# Patient Record
Sex: Male | Born: 1955 | Race: White | Hispanic: No | Marital: Married | Smoking: Never smoker
Health system: Southern US, Community
[De-identification: ages and names within clinical notes are randomized; demographics above are authoritative.]

## PROBLEM LIST (undated history)

## (undated) DIAGNOSIS — T7840XA Allergy, unspecified, initial encounter: Secondary | ICD-10-CM

## (undated) DIAGNOSIS — R519 Headache, unspecified: Secondary | ICD-10-CM

## (undated) DIAGNOSIS — M1990 Unspecified osteoarthritis, unspecified site: Secondary | ICD-10-CM

## (undated) DIAGNOSIS — K219 Gastro-esophageal reflux disease without esophagitis: Secondary | ICD-10-CM

## (undated) DIAGNOSIS — Z872 Personal history of diseases of the skin and subcutaneous tissue: Secondary | ICD-10-CM

## (undated) DIAGNOSIS — N529 Male erectile dysfunction, unspecified: Secondary | ICD-10-CM

## (undated) DIAGNOSIS — J449 Chronic obstructive pulmonary disease, unspecified: Secondary | ICD-10-CM

## (undated) DIAGNOSIS — M722 Plantar fascial fibromatosis: Secondary | ICD-10-CM

## (undated) DIAGNOSIS — G8929 Other chronic pain: Secondary | ICD-10-CM

## (undated) HISTORY — DX: Allergy, unspecified, initial encounter: T78.40XA

## (undated) HISTORY — PX: COLONOSCOPY W/ BIOPSIES AND POLYPECTOMY: SHX1376

## (undated) HISTORY — DX: Headache, unspecified: R51.9

## (undated) HISTORY — DX: Male erectile dysfunction, unspecified: N52.9

## (undated) HISTORY — DX: Unspecified osteoarthritis, unspecified site: M19.90

## (undated) HISTORY — PX: CATARACT EXTRACTION: SUR2

## (undated) HISTORY — DX: Gastro-esophageal reflux disease without esophagitis: K21.9

## (undated) HISTORY — DX: Other chronic pain: G89.29

## (undated) HISTORY — DX: Personal history of diseases of the skin and subcutaneous tissue: Z87.2

## (undated) HISTORY — DX: Plantar fascial fibromatosis: M72.2

---

## 2004-08-07 ENCOUNTER — Encounter: Admission: RE | Admit: 2004-08-07 | Discharge: 2004-08-07 | Payer: Self-pay | Admitting: Family Medicine

## 2010-02-10 ENCOUNTER — Encounter: Payer: Self-pay | Admitting: Otolaryngology

## 2016-06-10 ENCOUNTER — Other Ambulatory Visit: Payer: Self-pay | Admitting: Otolaryngology

## 2016-06-10 DIAGNOSIS — J324 Chronic pansinusitis: Secondary | ICD-10-CM

## 2016-06-13 ENCOUNTER — Ambulatory Visit
Admission: RE | Admit: 2016-06-13 | Discharge: 2016-06-13 | Disposition: A | Discharge status: Home / Self Care | Payer: No Typology Code available for payment source | Source: Ambulatory Visit | Attending: Otolaryngology | Admitting: Otolaryngology

## 2016-06-13 DIAGNOSIS — J324 Chronic pansinusitis: Secondary | ICD-10-CM

## 2016-10-13 ENCOUNTER — Ambulatory Visit (INDEPENDENT_AMBULATORY_CARE_PROVIDER_SITE_OTHER): Payer: BLUE CROSS/BLUE SHIELD | Admitting: Neurology

## 2016-10-13 ENCOUNTER — Encounter: Payer: Self-pay | Admitting: Neurology

## 2016-10-13 VITALS — BP 149/89 | HR 60 | Ht 75.0 in | Wt 207.0 lb

## 2016-10-13 DIAGNOSIS — R400 Somnolence: Secondary | ICD-10-CM | POA: Diagnosis not present

## 2016-10-13 DIAGNOSIS — R519 Headache, unspecified: Secondary | ICD-10-CM

## 2016-10-13 DIAGNOSIS — R51 Headache: Secondary | ICD-10-CM | POA: Diagnosis not present

## 2016-10-13 DIAGNOSIS — G8929 Other chronic pain: Secondary | ICD-10-CM | POA: Insufficient documentation

## 2016-10-13 DIAGNOSIS — H93A9 Pulsatile tinnitus, unspecified ear: Secondary | ICD-10-CM

## 2016-10-13 DIAGNOSIS — I671 Cerebral aneurysm, nonruptured: Secondary | ICD-10-CM | POA: Diagnosis not present

## 2016-10-13 DIAGNOSIS — R42 Dizziness and giddiness: Secondary | ICD-10-CM

## 2016-10-13 NOTE — Addendum Note (Signed)
Addended by: Naomie Dean B on: 10/13/2016 08:14 AM   Modules accepted: Orders

## 2016-10-13 NOTE — Patient Instructions (Signed)
Remember to drink plenty of fluid, eat healthy meals and do not skip any meals. Try to eat protein with a every meal and eat a healthy snack such as fruit or nuts in between meals. Try to keep a regular sleep-wake schedule and try to exercise daily, particularly in the form of walking, 20-30 minutes a day, if you can.   As far as diagnostic testing: Imaging of the brain and evaluation by sleep doctor  I would like to see you back in 4-6 weeks, sooner if we need to. Please call us with any interim questions, concerns, problems, updates or refill requests.   Our phone number is [phone]. We also have an after hours call service for urgent matters and there is a physician on-call for urgent questions. For any emergencies you know to call 911 or go to the nearest emergency room

## 2016-10-13 NOTE — Progress Notes (Signed)
[facility]    Provider:  Dr Lucia Gaskins Referring Provider: Barbie Banner, MD Primary Care Physician:  Barbie Banner, MD  CC:  Facial pain  HPI:  Mark [name] is a 61 y.o. male here as a referral from Dr. Andrey Campanile for facial pain. Patient saw Dr. Dorma Russell who did not feel patient had sinus issues. Prednisone has not helped. Antibiotics have not helped. Past medical history of arthritis. Symptoms started last December as a mild dull headache in the frontal areas, no significant hx of headaches. PCP treated with amoxicillin and steroids, nasal sprays nothing changed. He saw Dr. Dorma Russell who treated as well and examined him and said this is not sinuses. His upper teeth hurt bilaterally, he has been to a dentist. Over his eyes he feels tightness and a "drawing sensation" in his eyes. Started with a dizzy spell, no other inciting events. Also then had Tinnitus in the left ear. Vision changes. Continuously moderate, he goes to sleep with it and wakes up with it. Continuous for over 4 months. Constant feeling, vision changes, new left-sided hearing changes. He has light sensitivity. No phonophotobia. No nausea, no vomiting. He snores worse, wakes with headaches, he is more fatigued during the day. He has a family history of headaches. He takes liquid tylenol occasionally, not often. Less than 10x in a month. No medication overuse. No other focal neurologic deficits, associated symptoms, inciting events or modifiable factors.  Reviewed notes, labs and imaging from outside physicians, which showed:  Patient was recently seen by neurology at [facility]. Patient was given baclofen, vitamin B 2, for migraine relief which includes magnesium and vitamin B2 and feverfew. He was prescribed baclofen every 8 hours as needed for moderate headache pain. An MRI was suggested. 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 20 one headache days a months, Epworth sleepiness  scale was 9, no suggestion of sleep apnea. Current episode started more than one month ago, patient had rare headaches in the past treated with Tylenol, had a dizzy spell in November the last one day, one month later he developed a mild headache and eventual aching in the teeth and eyes feel tired. The pain is located in the bifrontal, frontal and retro-orbital region. Radiates to the face. The quality is described as boring, dull and aching. The pain is mild. Associated with dizziness, eye pain, nausea, vomiting, sinus pressure and tinnitus. No blurred vision, ear pain, fever, neck pain, numbness, phonophobia, vomiting or weakness. He has teeth pain, sensitive to odors, symptoms are aggravated by bright light, history of migraines in the family.   CT maxillofacial w/o contrast: 1. Minimal right maxillary sinus mucosal thickening, otherwise clear sinuses. 2. Rightward nasal septal spurring.  Review of Systems: Patient complains of symptoms per HPI as well as the following symptoms: Fatigue, blurred vision, headache, snoring, joint pain, hearing loss, ringing in the ears. Pertinent negatives and positives per HPI. All others negative.   Social History   Social History  . Marital status: Married    Spouse name: N/A  . Number of children: N/A  . Years of education: N/A   Occupational History  . Not on file.   Social History Main Topics  . Smoking status: Never Smoker  . Smokeless tobacco: Never Used  . Alcohol use No  . Drug use: No  . Sexual activity: Not on file   Other Topics Concern  . Not on file   Social History Narrative  .  No narrative on file    Family History  Problem Relation Age of Onset  . Heart disease Mother   . COPD Father     Past Medical History:  Diagnosis Date  . Arthritis   . GERD (gastroesophageal reflux disease)     No past surgical history on file.  Current Outpatient Prescriptions  Medication Sig Dispense Refill  . baclofen (LIORESAL) 10 MG  tablet Take 5-10 mg by mouth daily.    . lansoprazole (PREVACID) 30 MG capsule Take 30 mg by mouth daily.    . montelukast (SINGULAIR) 10 MG tablet Take 10 mg by mouth daily.    . Omega-3 Fatty Acids (OMEGA 3 PO) Take by mouth.    . ranitidine (ZANTAC) 150 MG tablet Take 150 mg by mouth daily.    . sildenafil (REVATIO) 20 MG tablet Take 2 to 4 tablets as needed for E.D.     No current facility-administered medications for this visit.     Allergies as of 10/13/2016  . (No Known Allergies)    Vitals: BP (!) 149/89   Pulse 60   Ht  (1.905 m)   Wt 207 lb (93.9 kg)   BMI 25.87 kg/m  Last Weight:  Wt Readings from Last 1 Encounters:  10/13/16 207 lb (93.9 kg)   Last Height:   Ht Readings from Last 1 Encounters:  10/13/16  (1.905 m)   Physical exam: Exam: Gen: NAD, conversant, well nourised, obese, well groomed                     CV: RRR, no MRG. No Carotid Bruits. No peripheral edema, warm, nontender Eyes: Conjunctivae clear without exudates or hemorrhage  Neuro: Detailed Neurologic Exam  Speech:    Speech is normal; fluent and spontaneous with normal comprehension.  Cognition:    The patient is oriented to person, place, and time;     recent and remote memory intact;     language fluent;     normal attention, concentration,     fund of knowledge Cranial Nerves:    The pupils are equal, round, and reactive to light. The fundi are normal and spontaneous venous pulsations are present. Visual fields are full to finger confrontation. Extraocular movements are intact. Trigeminal sensation is intact and the muscles of mastication are normal. The face is symmetric. The palate elevates in the midline. Hearing intact. Voice is normal. Shoulder shrug is normal. The tongue has normal motion without fasciculations.   Coordination:    Normal finger to nose and heel to shin. Normal rapid alternating movements.   Gait:    Heel-toe and tandem gait are normal.   Motor  Observation:    No asymmetry, no atrophy, and no involuntary movements noted. Tone:    Normal muscle tone.    Posture:    Posture is normal. normal erect    Strength:    Strength is V/V in the upper and lower limbs.      Sensation: intact to LT     Reflex Exam:  DTR's:    Deep tendon reflexes in the upper and lower extremities are normal bilaterally.   Toes:    The toes are downgoing bilaterally.   Clonus:    Clonus is absent.       Assessment/Plan:  New onset headache in a patient > 50 with left ear hearing loss, vertigo, vision changes, pulsatile tinnitus  - Needs MRI brain to evaluate for masses, lesions, schwannomas or  other etiologies - Need MRA of the head due to left ear pulsatile tinnitus to eval for aneurysm - Sleep eval for morning headaches, appears to have a crowded airway, excessive daytime fatigue to evaluate for sleep apnea  Cc: Dr. Andrey Campanile  Discussed: To prevent or relieve headaches, try the following: Cool Compress. Lie down and place a cool compress on your head.  Avoid headache triggers. If certain foods or odors seem to have triggered your migraines in the past, avoid them. A headache diary might help you identify triggers.  Include physical activity in your daily routine. Try a daily walk or other moderate aerobic exercise.  Manage stress. Find healthy ways to cope with the stressors, such as delegating tasks on your to-do list.  Practice relaxation techniques. Try deep breathing, yoga, massage and visualization.  Eat regularly. Eating regularly scheduled meals and maintaining a healthy diet might help prevent headaches. Also, drink plenty of fluids.  Follow a regular sleep schedule. Sleep deprivation might contribute to headaches Consider biofeedback. With this mind-body technique, you learn to control certain bodily functions - such as muscle tension, heart rate and blood pressure - to prevent headaches or reduce headache pain.    Proceed to  emergency room if you experience new or worsening symptoms or symptoms do not resolve, if you have new neurologic symptoms or if headache is severe, or for any concerning symptom.   Provided education and documentation from American headache Society toolbox including articles on: chronic migraine medication overuse headache, chronic migraines, prevention of migraines, behavioral and other nonpharmacologic treatments for headache.  Orders Placed This Encounter  Procedures  . MR BRAIN W WO CONTRAST  . MR MRA HEAD WO CONTRAST  . Comprehensive metabolic panel  . CBC  . Sedimentation rate  . C-reactive protein  . Ambulatory referral to Sleep Studies    Naomie Dean, MD  [facility] [address] [place], [place] [address]  Phone [phone] Fax [phone]

## 2016-10-14 ENCOUNTER — Ambulatory Visit (INDEPENDENT_AMBULATORY_CARE_PROVIDER_SITE_OTHER): Payer: BLUE CROSS/BLUE SHIELD

## 2016-10-14 DIAGNOSIS — I671 Cerebral aneurysm, nonruptured: Secondary | ICD-10-CM | POA: Diagnosis not present

## 2016-10-14 DIAGNOSIS — R51 Headache: Secondary | ICD-10-CM

## 2016-10-14 DIAGNOSIS — R400 Somnolence: Secondary | ICD-10-CM | POA: Diagnosis not present

## 2016-10-14 DIAGNOSIS — H93A9 Pulsatile tinnitus, unspecified ear: Secondary | ICD-10-CM

## 2016-10-14 DIAGNOSIS — R42 Dizziness and giddiness: Secondary | ICD-10-CM | POA: Diagnosis not present

## 2016-10-14 DIAGNOSIS — R519 Headache, unspecified: Secondary | ICD-10-CM

## 2016-10-14 LAB — COMPREHENSIVE METABOLIC PANEL
A/G RATIO: 2 (ref 1.2–2.2)
ALBUMIN: 4.3 g/dL (ref 3.6–4.8)
ALT: 21 IU/L (ref 0–44)
AST: 20 IU/L (ref 0–40)
Alkaline Phosphatase: 57 IU/L (ref 39–117)
BUN / CREAT RATIO: 18 (ref 10–24)
BUN: 18 mg/dL (ref 8–27)
Bilirubin Total: 0.2 mg/dL (ref 0.0–1.2)
CALCIUM: 9.2 mg/dL (ref 8.6–10.2)
CO2: 24 mmol/L (ref 20–29)
CREATININE: 0.98 mg/dL (ref 0.76–1.27)
Chloride: 106 mmol/L (ref 96–106)
GFR, EST AFRICAN AMERICAN: 96 mL/min/{1.73_m2} (ref >59)
GFR, EST NON AFRICAN AMERICAN: 83 mL/min/{1.73_m2} (ref >59)
GLOBULIN, TOTAL: 2.1 g/dL (ref 1.5–4.5)
Glucose: 89 mg/dL (ref 65–99)
POTASSIUM: 5.1 mmol/L (ref 3.5–5.2)
SODIUM: 144 mmol/L (ref 134–144)
TOTAL PROTEIN: 6.4 g/dL (ref 6.0–8.5)

## 2016-10-14 LAB — CBC
Hematocrit: 46.6 % (ref 37.5–51.0)
Hemoglobin: 15.8 g/dL (ref 13.0–17.7)
MCH: 31.5 pg (ref 26.6–33.0)
MCHC: 33.9 g/dL (ref 31.5–35.7)
MCV: 93 fL (ref 79–97)
PLATELETS: 189 10*3/uL (ref 150–379)
RBC: 5.02 x10E6/uL (ref 4.14–5.80)
RDW: 13.4 % (ref 12.3–15.4)
WBC: 5.9 10*3/uL (ref 3.4–10.8)

## 2016-10-14 LAB — SEDIMENTATION RATE: SED RATE: 2 mm/h (ref 0–30)

## 2016-10-14 LAB — C-REACTIVE PROTEIN: CRP: 0.9 mg/L (ref 0.0–4.9)

## 2016-10-14 MED ORDER — GADOPENTETATE DIMEGLUMINE 469.01 MG/ML IV SOLN: 19.0000 mL | Freq: Once | INTRAVENOUS | Status: AC | PRN

## 2016-10-15 ENCOUNTER — Ambulatory Visit (INDEPENDENT_AMBULATORY_CARE_PROVIDER_SITE_OTHER): Payer: BLUE CROSS/BLUE SHIELD | Admitting: Neurology

## 2016-10-15 ENCOUNTER — Encounter: Payer: Self-pay | Admitting: Neurology

## 2016-10-15 VITALS — BP 142/83 | HR 65 | Ht 75.0 in | Wt 214.0 lb

## 2016-10-15 DIAGNOSIS — E663 Overweight: Secondary | ICD-10-CM | POA: Diagnosis not present

## 2016-10-15 DIAGNOSIS — G4719 Other hypersomnia: Secondary | ICD-10-CM | POA: Diagnosis not present

## 2016-10-15 DIAGNOSIS — R519 Headache, unspecified: Secondary | ICD-10-CM

## 2016-10-15 DIAGNOSIS — R0683 Snoring: Secondary | ICD-10-CM

## 2016-10-15 DIAGNOSIS — R51 Headache: Secondary | ICD-10-CM | POA: Diagnosis not present

## 2016-10-15 LAB — TSH

## 2016-10-15 NOTE — Patient Instructions (Addendum)
Thank you for choosing [facility] for your sleep related care! It was nice to meet you today!  Here is what we discussed today and what we came up with as our plan for you:    Based on your symptoms and your exam I believe you may be at some risk for obstructive sleep apnea or OSA, and I think we should proceed with a sleep study to determine whether you do or do not have OSA and how severe it is. If you have more than mild OSA, I want you to consider treatment with CPAP. Please remember, the risks and ramifications of moderate to severe obstructive sleep apnea or OSA are: Cardiovascular disease, including congestive heart failure, stroke, difficult to control hypertension, arrhythmias, and even type 2 diabetes has been linked to untreated OSA. Sleep apnea causes disruption of sleep and sleep deprivation in most cases, which, in turn, can cause recurrent headaches, problems with memory, mood, concentration, focus, and vigilance. Most people with untreated sleep apnea report excessive daytime sleepiness, which can affect their ability to drive. Please do not drive if you feel sleepy.   When you are ready, our sleep lab administrative assistant, Alvis Lemmings will call you to schedule your sleep study. If you don't hear back from her by about 2 weeks from now, please feel free to call her at [phone]. You can leave a message with your phone number and concerns, if you get the voicemail box. She will call back as soon as possible.

## 2016-10-15 NOTE — Progress Notes (Signed)
Subjective:    Patient ID: Mark [name] is a 61 y.o. male.  HPI     Huston Foley, MD, PhD [facility] [address], [address] [address] [place], [place] [address]  Dear Desma Maxim,   I saw your patient, Mark [name], upon your kind request in my clinic today for initial consultation of his sleep disorder, in particular, concern for underlying obstructive sleep apnea. The patient is accompanied by his wife today. As you know, Mark [name] is a 61 year old right-handed gentleman with an underlying medical history of reflux disease, arthritis, recurrent headaches and borderline overweight state, who reports snoring and excessive daytime somnolence. I reviewed your office note from 10/13/2016. He has had a 10 month history of constant, dull, achy, mostly frontal HAs, treated for sinusitis repeatedly. Saw his eye doctor, got update glasses and has readers. Has occasional RLS symptoms, not bothersome.  Saw his dentist and ENT (Dr. Jac Canavan), had a CT sinuses.  His Epworth sleepiness score is 7 out of 24, fatigue score is 17 out of 63. He occasionally takes a 30 minute nap. He is retired from [employer] and is busy with other activities including spending time with his grandchildren. He drinks quite a bit of coffee about 20-24 ounces a day, typically no sodas or tea. He quit smoking 2-1/2 years ago, smoked for 40 years. Bedtime is around 10 and he likes to read for up to 30 minutes. Wake up time varies, sometimes as early as 5, he used to wake up early for work. He denies morning headaches or night to night nocturia, snoring is typically on the milder side per wife, he does not tend to twitch or tic in his sleep. He had MRI brain and MRA yesterday, results pending.  His Past Medical History Is Significant For: Past Medical History:  Diagnosis Date  . Arthritis   . GERD (gastroesophageal reflux disease)     His Past Surgical History Is Significant For: No past surgical history on  file.  His Family History Is Significant For: Family History  Problem Relation Age of Onset  . Heart disease Mother   . COPD Father     His Social History Is Significant For: Social History   Social History  . Marital status: Married    Spouse name: N/A  . Number of children: N/A  . Years of education: N/A   Social History Main Topics  . Smoking status: Never Smoker  . Smokeless tobacco: Never Used  . Alcohol use No  . Drug use: No  . Sexual activity: Not Asked   Other Topics Concern  . None   Social History Narrative  . None    His Allergies Are:  No Known Allergies:   His Current Medications Are:  Outpatient Encounter Prescriptions as of 10/15/2016  Medication Sig  . baclofen (LIORESAL) 10 MG tablet Take 5-10 mg by mouth daily.  . lansoprazole (PREVACID) 30 MG capsule Take 30 mg by mouth every 3 (three) days.   . MELOXICAM PO Take by mouth 4 (four) times a week.  . montelukast (SINGULAIR) 10 MG tablet Take 10 mg by mouth daily.  . Omega-3 Fatty Acids (OMEGA 3 PO) Take by mouth.  . ranitidine (ZANTAC) 150 MG tablet Take 150 mg by mouth 4 (four) times a week.   . sildenafil (REVATIO) 20 MG tablet Take 2 to 4 tablets as needed for E.D.   Facility-Administered Encounter Medications as of 10/15/2016  Medication  . gadopentetate dimeglumine (MAGNEVIST) injection  19 mL  :  Review of Systems:  Out of a complete 14 point review of systems, all are reviewed and negative with the exception of these symptoms as listed below:  Review of Systems  Neurological:       Pt presents today to discuss his sleep. Pt has never had a sleep study but does endorse snoring.  Epworth Sleepiness Scale 0= would never doze 1= slight chance of dozing 2= moderate chance of dozing 3= high chance of dozing  Sitting and reading: 1 Watching TV: 1 Sitting inactive in a public place (ex. Theater or meeting): 1 As a passenger in a car for an hour without a break: 1 Lying down to rest in  the afternoon: 2 Sitting and talking to someone: 0 Sitting quietly after lunch (no alcohol): 1 In a car, while stopped in traffic: 0 Total: 7     Objective:  Neurological Exam  Physical Exam Physical Examination:   Vitals:   10/15/16 0819  BP: (!) 142/83  Pulse: 65    General Examination: The patient is a very pleasant 61 y.o. male in no acute distress. He appears well-developed and well-nourished and well groomed.   HEENT: Normocephalic, atraumatic, pupils are equal, round and reactive to light and accommodation. Some excessive blinking noted. Extraocular tracking is good without limitation to gaze excursion or nystagmus noted. Normal smooth pursuit is noted. Hearing is grossly intact. Face is symmetric with normal facial animation and normal facial sensation. Speech is clear with no dysarthria noted. There is no hypophonia. There is no lip, neck/head, jaw or voice tremor. Neck is supple with full range of passive and active motion. There are no carotid bruits on auscultation. Oropharynx exam reveals: mild mouth dryness, adequate dental hygiene and moderate airway crowding, due to Smaller airway entry, thicker soft palate, tonsils in place, about 1+ bilaterally. Neck circumference is 15-3/8 inches. Mallampati is class II. He has a mild to moderate overbite.  Chest: Clear to auscultation without wheezing, rhonchi or crackles noted.  Heart: unremarkable exam.    Abdomen: Non-distended.  Extremities: There is no edema.  Skin: Warm and dry without trophic changes noted. There are no .  Musculoskeletal: exam reveals no obvious joint deformities, tenderness or joint swelling or erythema.   Neurologically:  Mental status: The patient is awake, alert and oriented in all 4 spheres. His immediate and remote memory, attention, language skills and fund of knowledge are appropriate. There is no evidence of aphasia, agnosia, apraxia or anomia. Speech is clear with normal prosody and  enunciation. Thought process is linear. Mood is normal and affect is normal.  Cranial nerves II - XII are as described above under HEENT exam. In addition: shoulder shrug is normal with equal shoulder height noted. Motor exam: Normal bulk, strength and tone is noted. There is no drift, tremor or rebound. Reflexes are 2+ throughout. Fine motor skills and coordination: intact with normal finger taps, normal hand movements, normal rapid alternating patting, normal foot taps and normal foot agility.  Cerebellar testing: No dysmetria or intention tremor on finger to nose testing. Heel to shin is unremarkable bilaterally. There is no truncal or gait ataxia.  Sensory exam: intact to light touch in the upper and lower extremities.  Gait, station and balance: He stands easily. No veering to one side is noted. No leaning to one side is noted. Posture is age-appropriate and stance is narrow based. Gait shows normal stride length and normal pace. No problems turning are noted, reduced arm  swing on the right, holds it stiffer as if protecting.               Assessment and Plan:  In summary, Mark [name] is a very pleasant 62 y.o.-year old male  with an underlying medical history of reflux disease, arthritis, recurrent headaches and borderline overweight state, who presents for sleep evaluation. While he does not have a telltale history concerning for sleep apnea, his new onset headaches and crowded appearing airway could point towards underlying obstructive sleep apnea (OSA). I had a long chat with the patient and his wife about my findings and the diagnosis of OSA, its prognosis and treatment options. We talked about medical treatments, surgical interventions and non-pharmacological approaches. I explained in particular the risks and ramifications of untreated moderate to severe OSA, especially with respect to developing cardiovascular disease down the Road, including congestive heart failure, difficult to treat  hypertension, cardiac arrhythmias, or stroke. Even type 2 diabetes has, in part, been linked to untreated OSA. Symptoms of untreated OSA include daytime sleepiness, memory problems, mood irritability and mood disorder such as depression and anxiety, lack of energy, as well as recurrent headaches, especially morning headaches. We talked about maintaining a healthy lifestyle in general, as well as the importance of weight control. I encouraged the patient to eat healthy, exercise daily and keep well hydrated, to keep a scheduled bedtime and wake time routine, to not skip any meals and eat healthy snacks in between meals. I advised the patient not to drive when feeling sleepy. I recommended the following at this time: sleep study with potential positive airway pressure titration. (We will score hypopneas at 3%). He would like to wait it out a little longer, he recently had scans and would like to know the results and would like to think about sleep study testing. I explained the sleep test procedure to the patient and also outlined possible surgical and non-surgical treatment options of OSA, including the use of a custom-made dental device (which would require a referral to a specialist dentist or oral surgeon), upper airway surgical options, such as pillar implants, radiofrequency surgery, tongue base surgery, and UPPP (which would involve a referral to an ENT surgeon). Rarely, jaw surgery such as mandibular advancement may be considered.  I also explained the CPAP treatment option to the patient, who indicated that he would be willing to try CPAP if the need arises. I explained the importance of being compliant with PAP treatment, not only for insurance purposes but primarily to improve His symptoms, and for the patient's long term health benefit, including to reduce His cardiovascular risks. I answered all their questions today and the patient and his wife were in agreement. I will likely see him back after the  sleep study is completed and encouraged him to call with any interim questions, concerns, problems or updates.   Thank you very much for allowing me to participate in the care of this nice patient. If I can be of any further assistance to you please do not hesitate to talk to me.  Sincerely,   Huston Foley, MD, PhD

## 2016-10-17 ENCOUNTER — Telehealth: Payer: Self-pay | Admitting: *Deleted

## 2016-10-17 LAB — SPECIMEN STATUS REPORT

## 2016-10-17 LAB — TSH: TSH: 2.25 u[IU]/mL (ref 0.450–4.500)

## 2016-10-17 NOTE — Telephone Encounter (Signed)
LVM informing patient his TSH (thyroid) lab test is normal . Left number for any questions, advised office closes at noon today.

## 2016-10-17 NOTE — Telephone Encounter (Signed)
Pt returned RN's call. Msg relayed, he understood and did not have any questions  FYI

## 2016-10-17 NOTE — Telephone Encounter (Signed)
Spoke with patient and informed him, per Dr Lucia Gaskins that both the MRI of brain and MRA of head were normal imaging tests, normal results. He verbalized understanding, appreciation.

## 2016-11-13 ENCOUNTER — Telehealth: Payer: Self-pay

## 2016-11-13 NOTE — Telephone Encounter (Signed)
Spoke with patient to schedule home sleep sleep study. Insurance denied in-lab study. He wants to wait til he sees Doctor Lucia GaskinsAhern on 11-24-16

## 2016-11-24 ENCOUNTER — Ambulatory Visit: Payer: BLUE CROSS/BLUE SHIELD | Admitting: Neurology

## 2018-02-23 ENCOUNTER — Ambulatory Visit: Payer: BC Managed Care – PPO | Admitting: Allergy

## 2018-02-23 ENCOUNTER — Encounter: Payer: Self-pay | Admitting: Allergy

## 2018-02-23 VITALS — BP 124/88 | HR 69 | Temp 97.8°F | Resp 16 | Ht 74.0 in | Wt 215.8 lb

## 2018-02-23 DIAGNOSIS — J310 Chronic rhinitis: Secondary | ICD-10-CM

## 2018-02-23 NOTE — Patient Instructions (Signed)
   May use Flonase 1-2 sprays daily.   Nasal saline spray (i.e., Simply Saline) or nasal saline lavage (i.e., NeilMed) is recommended as needed and prior to medicated nasal sprays.  Get eyes checked every year. If having issues with pressure then may need to stop the nasal spray.   Nose Bleeds: Nosebleeds are very common.  Site of the bleeding is typically on the septum or at the very front of the nose.  Some of the more common causes are from trauma, inflammation or medication induced. Preventative treatment: 1.  Apply saline nasal gel in each nostril twice a day for 2 weeks to allow the nasal mucosa to heal 2.  Consider using a humidifier in the winter 3. Try to keep your blood pressure as normal as possible (120/80)  Follow up as needed.

## 2018-02-23 NOTE — Assessment & Plan Note (Signed)
Perennial rhinitis symptoms for the past 10+ years with worsening in the winter.  2019 skin testing performed at an outside facility was negative to environmental allergies. Currently on Flonase Sensimist 2 sprays once a day for 1 year with good benefit.  Discussed with patient that he most likely has nonallergic rhinitis which seems to be responding well to the Flonase. He was concerned about long-term side effects of Flonase use. Most common side effect is epistaxis which he has not had. Reviewed proper nasal spray use to minimize epistaxis. If it does occur advised him to stop using Flonase for 1 week or so let the nasal mucosa heal.  Other rare but serious side effects include increased intraocular pressure. Patient has been getting annual eye exams with no issues.  Continue Flonase 1-2 sprays daily.   Nasal saline spray (i.e., Simply Saline) or nasal saline lavage (i.e., NeilMed) is recommended as needed and prior to medicated nasal sprays.  Get eyes checked every year. If having issues with pressure then may need to stop the nasal spray.

## 2018-02-23 NOTE — Progress Notes (Signed)
New Patient Note  RE: Mark [name] MRN: [id number] DOB: 06-Nov-1955 Date of Office Visit: 02/23/2018  Referring provider: No ref. provider found Primary care provider: Barbie BannerWilson, [name], MD  Chief Complaint: Advice Only (Second Opinion)  History of Present Illness: I had the pleasure of seeing Mark [name] for initial evaluation at the [facility] on 02/23/2018. He is a 63 y.o. male, who is self-referred here for the evaluation of second opinion.  He reports symptoms of nasal congestions, rhinorrhea, sneezing, itchy eyes . Symptoms have been going on for 10+ years. The symptoms are present all year around with worsening in winter. Other triggers include exposure to none. Anosmia: no. Headache: yes but improved with Flonase. He has used Flonase sensamist 2 sprays QD x 1 yr with fair improvement in symptoms. No nasal bleeds. Sinus infections: no. Previous work up includes: 2019 skin testing at [facility] was negative to environmental allergies.  Patient has been using Flonase sensamist which is helping with the nasal congestion and headaches. Concerned about long term use side effects.  Tried montelukast with no benefit.  Previous ENT evaluation: 1 year ago was evaluated by Dr. Ermalinda BarriosEric Kraus. Previous sinus imaging: Xrays were clear. No sinus surgery in the past.   Gets annual eye exams.   Assessment and Plan: Mark [name] is a 63 y.o. male with: Non-allergic rhinitis Perennial rhinitis symptoms for the past 10+ years with worsening in the winter.  2019 skin testing performed at an outside facility was negative to environmental allergies. Currently on Flonase Sensimist 2 sprays once a day for 1 year with good benefit.  Discussed with patient that he most likely has nonallergic rhinitis which seems to be responding well to the Flonase. He was concerned about long-term side effects of Flonase use. Most common side effect is epistaxis which he has not had. Reviewed proper nasal  spray use to minimize epistaxis. If it does occur advised him to stop using Flonase for 1 week or so let the nasal mucosa heal.  Other rare but serious side effects include increased intraocular pressure. Patient has been getting annual eye exams with no issues.  Continue Flonase 1-2 sprays daily.   Nasal saline spray (i.e., Simply Saline) or nasal saline lavage (i.e., NeilMed) is recommended as needed and prior to medicated nasal sprays.  Get eyes checked every year. If having issues with pressure then may need to stop the nasal spray.  Return if symptoms worsen or fail to improve.  Other allergy screening: Asthma: no Food allergy: no Medication allergy: no Hymenoptera allergy: no Urticaria: no Eczema:no History of recurrent infections suggestive of immunodeficency: no  Diagnostics: None.     Past Medical History: Patient Active Problem List   Diagnosis Date Noted  . Non-allergic rhinitis 02/23/2018  . Chronic intractable headache 10/13/2016   Past Medical History:  Diagnosis Date  . Arthritis   . GERD (gastroesophageal reflux disease)    Past Surgical History: History reviewed. No pertinent surgical history. Medication List:  Current Outpatient Medications  Medication Sig Dispense Refill  . acetaminophen (TYLENOL) 500 MG tablet Take by mouth.    . baclofen (LIORESAL) 10 MG tablet Take 5-10 mg by mouth daily.    . fluticasone (FLONASE) 50 MCG/ACT nasal spray Place into both nostrils daily.    . lansoprazole (PREVACID) 30 MG capsule TAKE ONE CAPSULE BY MOUTH DAILY AS NEEDED TO CONTROL ACID REFLUX SYMPTOMS    . meloxicam (MOBIC) 15 MG tablet Take one tablet daily  with food as needed for arthritis pain.    . Omega-3 Fatty Acids (OMEGA 3 PO) Take by mouth.    . ranitidine (ZANTAC) 150 MG tablet Take 150 mg by mouth 4 (four) times a week.     . sildenafil (REVATIO) 20 MG tablet Take 2 to 4 tablets as needed for E.D.    . montelukast (SINGULAIR) 10 MG tablet Take 10 mg by  mouth daily.     No current facility-administered medications for this visit.    Facility-Administered Medications Ordered in Other Visits  Medication Dose Route Frequency Provider Last Rate Last Dose  . gadopentetate dimeglumine (MAGNEVIST) injection 19 mL  19 mL Intravenous Once PRN Anson Fret, MD       Allergies: No Known Allergies Social History: Social History   Socioeconomic History  . Marital status: Married    Spouse name: Not on file  . Number of children: Not on file  . Years of education: Not on file  . Highest education level: Not on file  Occupational History  . Not on file  Social Needs  . Financial resource strain: Not on file  . Food insecurity:    Worry: Not on file    Inability: Not on file  . Transportation needs:    Medical: Not on file    Non-medical: Not on file  Tobacco Use  . Smoking status: Never Smoker  . Smokeless tobacco: Never Used  Substance and Sexual Activity  . Alcohol use: No  . Drug use: No  . Sexual activity: Not on file  Lifestyle  . Physical activity:    Days per week: Not on file    Minutes per session: Not on file  . Stress: Not on file  Relationships  . Social connections:    Talks on phone: Not on file    Gets together: Not on file    Attends religious service: Not on file    Active member of club or organization: Not on file    Attends meetings of clubs or organizations: Not on file    Relationship status: Not on file  Other Topics Concern  . Not on file  Social History Narrative  . Not on file   Lives in a [address] year old home. Smoking: smoked from 1975-2015 Occupation: retired  Landscape architect History: Immunologist in the house: no Engineer, civil (consulting) in the family room: no Carpet in the bedroom: yes Heating: gas and electric Cooling: central Pet: no  Family History: Family History  Problem Relation Age of Onset  . Heart disease Mother   . COPD Father    Problem                                Relation Asthma                                   No  Eczema                                No  Food allergy                          No  Allergic rhino conjunctivitis     No   Review of Systems  Constitutional: Negative for appetite change,  chills, fever and unexpected weight change.  HENT: Positive for congestion. Negative for rhinorrhea.   Eyes: Negative for itching.  Respiratory: Negative for cough, chest tightness, shortness of breath and wheezing.   Cardiovascular: Negative for chest pain.  Gastrointestinal: Negative for abdominal pain.  Genitourinary: Negative for difficulty urinating.  Skin: Negative for rash.  Allergic/Immunologic: Negative for environmental allergies and food allergies.  Neurological: Positive for headaches.   Objective: BP 124/88 (BP Location: Left Arm, Patient Position: Sitting, Cuff Size: Normal)   Pulse 69   Temp 97.8 F (36.6 C) (Oral)   Resp 16   Ht 6\' 2"  (1.88 m)   Wt 215 lb 12.8 oz (97.9 kg)   SpO2 98%   BMI 27.71 kg/m  Body mass index is 27.71 kg/m. Physical Exam  Constitutional: He is oriented to person, place, and time. He appears well-developed and well-nourished.  HENT:  Head: Normocephalic and atraumatic.  Right Ear: External ear normal.  Left Ear: External ear normal.  Nose: Nose normal.  Mouth/Throat: Oropharynx is clear and moist.  Eyes: Conjunctivae and EOM are normal.  Neck: Neck supple.  Cardiovascular: Normal rate, regular rhythm and normal heart sounds. Exam reveals no gallop and no friction rub.  No murmur heard. Pulmonary/Chest: Effort normal and breath sounds normal. He has no wheezes. He has no rales.  Abdominal: Soft.  Lymphadenopathy:    He has no cervical adenopathy.  Neurological: He is alert and oriented to person, place, and time.  Skin: Skin is warm. No rash noted.  Psychiatric: He has a normal mood and affect. His behavior is normal.  Nursing note and vitals reviewed.  The plan was reviewed with the  patient/family, and all questions/concerned were addressed.  It was my pleasure to see Braven today and participate in his care. Please feel free to contact me with any questions or concerns.  Sincerely,  Wyline Mood, DO Allergy & Immunology  [facility] office: [phone] [facility] office: [phone]

## 2019-03-01 ENCOUNTER — Ambulatory Visit: Payer: BC Managed Care – PPO | Attending: Internal Medicine

## 2019-03-03 ENCOUNTER — Ambulatory Visit: Payer: Self-pay

## 2019-05-18 NOTE — Progress Notes (Signed)
Cardiology Office Note:    Date:  05/19/2019   ID:  NEFTALY SWISS, DOB 1955-06-02, MRN [id number]  PCP:  Christain Sacramento, MD  Cardiologist:  No primary care provider on file.   Referring MD: Christain Sacramento, MD   Chief Complaint  Patient presents with  . Advice Only    Does not feel well  . Hyperlipidemia    History of Present Illness:    Mark [name] is a 64 y.o. male with a hx of hyperlipidemia, family h/o CAD, ED, and feels fatigued and not optimal.  Here to make sure that there are no cardiovascular issues causing his complaints..  His mother, Ms. Darden Palmer was a patient of mine.  He does not have any information on his father.  His mother had PAD and CAD.  She was a smoker.  He is a prior 40-year smoker.  He has been experiencing decreased energy over the past 5 to 6 years.  He has brief episodes of up to 5 seconds where his heart will flutter.  He has been having difficulty sleeping.  He awakens frequently.  His wife is telling him that he snores.  He also occasionally awakens with the left arm feeling numb.  Sitting up or changing positions helps him to go away.  He wanted to be sure that this was not related to his heart.  Despite these concerns, he remains active.  He has a farm.  He does all that is required of him.  It does not precipitate chest discomfort.  He denies orthopnea and PND.  Past Medical History:  Diagnosis Date  . Allergies   . Arthritis   . Chronic headaches   . ED (erectile dysfunction)   . GERD (gastroesophageal reflux disease)   . H/O sebaceous cyst   . Plantar fasciitis     Past Surgical History:  Procedure Laterality Date  . COLONOSCOPY W/ BIOPSIES AND POLYPECTOMY      Current Medications: Current Meds  Medication Sig  . acetaminophen (TYLENOL) 500 MG tablet Take 500 mg by mouth as needed.   . baclofen (LIORESAL) 10 MG tablet Take 5-10 mg by mouth as needed.   . fluticasone (FLONASE) 50 MCG/ACT nasal spray Place into both nostrils  daily.  . lansoprazole (PREVACID) 30 MG capsule TAKE ONE CAPSULE BY MOUTH DAILY AS NEEDED TO CONTROL ACID REFLUX SYMPTOMS  . meloxicam (MOBIC) 15 MG tablet Take one tablet daily with food as needed for arthritis pain.  . Omega-3 Fatty Acids (OMEGA 3 PO) Take by mouth.  . ranitidine (ZANTAC) 150 MG tablet Take 150 mg by mouth as needed.   . sildenafil (REVATIO) 20 MG tablet Take 2 to 4 tablets as needed for E.D.     Allergies:   Patient has no known allergies.   Social History   Socioeconomic History  . Marital status: Married    Spouse name: Not on file  . Number of children: Not on file  . Years of education: Not on file  . Highest education level: Not on file  Occupational History  . Not on file  Tobacco Use  . Smoking status: Never Smoker  . Smokeless tobacco: Never Used  Substance and Sexual Activity  . Alcohol use: No  . Drug use: No  . Sexual activity: Not on file  Other Topics Concern  . Not on file  Social History Narrative  . Not on file   Social Determinants of Health   Financial Resource Strain:   .  Difficulty of Paying Living Expenses:   Food Insecurity:   . Worried About Programme researcher, broadcasting/film/video in the Last Year:   . Barista in the Last Year:   Transportation Needs:   . Freight forwarder (Medical):   Marland Kitchen Lack of Transportation (Non-Medical):   Physical Activity:   . Days of Exercise per Week:   . Minutes of Exercise per Session:   Stress:   . Feeling of Stress :   Social Connections:   . Frequency of Communication with Friends and Family:   . Frequency of Social Gatherings with Friends and Family:   . Attends Religious Services:   . Active Member of Clubs or Organizations:   . Attends Banker Meetings:   Marland Kitchen Marital Status:      Family History: The patient's family history includes COPD in his father; Heart disease in his mother.  ROS:   Please see the history of present illness.    Arthritis in both knees slows his ability to  ambulate.  He knows of having an elevated cholesterol.  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  He has run LDL cholesterol levels greater than 130 on each instance.  Most recently 157.  All other systems reviewed and are negative.  EKGs/Labs/Other Studies Reviewed:    The following studies were reviewed today:  LDL 157 (03/18/19)  EKG:  EKG mistakenly, and EKG was not performed.  Recent Labs: No results found for requested labs within last 8760 hours.  Recent Lipid Panel No results found for: CHOL, TRIG, HDL, CHOLHDL, VLDL, LDLCALC, LDLDIRECT  Physical Exam:    VS:  BP 128/84   Pulse 60   Ht 6\' 2"  (1.88 m)   Wt 216 lb (98 kg)   SpO2 97%   BMI 27.73 kg/m     Wt Readings from Last 3 Encounters:  05/19/19 216 lb (98 kg)  02/23/18 215 lb 12.8 oz (97.9 kg)  10/15/16 214 lb (97.1 kg)     GEN: Healthy-appearing and slightly overweight. No acute distress HEENT: Normal NECK: No JVD. LYMPHATICS: No lymphadenopathy CARDIAC:  RRR without murmur, gallop, or edema. VASCULAR:  Normal Pulses. No bruits. RESPIRATORY:  Clear to auscultation without rales, wheezing or rhonchi  ABDOMEN: Soft, non-tender, non-distended, No pulsatile mass, MUSCULOSKELETAL: No deformity  SKIN: Warm and dry NEUROLOGIC:  Alert and oriented x 3 PSYCHIATRIC:  Normal affect   ASSESSMENT:    1. Family history of early CAD   2. Hyperlipidemia LDL goal <70   3. Other fatigue   4. Snoring   5. Erectile dysfunction, unspecified erectile dysfunction type   6. SOB (shortness of breath)   7. Educated about COVID-19 virus infection   8. Palpitations    PLAN:    In order of problems listed above:  1. His personal risk factors for CAD include hyperlipidemia, prior smoking, and family history.  We will perform a coronary calcium score to help identify risk.  I want him to start statin therapy but he is resistant.  The calcium score will help further risk stratify. 2. Statin therapy is  needed.  He is resistant.  Coronaries calcium score is being ordered. 3. He is not sleeping well, snores, and I believe fatigue may be related to sleep apnea. 4. Rule out sleep apnea.  Sleep study will be done. 5. This is a vascular risk factor. 6. Further evaluation may be necessary depending upon findings of the calcium score.  He may need an echo. 7. COVID-19 vaccine has been received.  Social distancing and mask wearing is being practiced. 8. These are very brief and at this point we are not likely to catch much because they are infrequent.  May need monitoring at some point in the future.  Overall education and awareness concerning primary risk prevention was discussed in detail: LDL less than 70, hemoglobin A1c less than 7, blood pressure target less than 130/80 mmHg, >150 minutes of moderate aerobic activity per week, avoidance of smoking, weight control (via diet and exercise), and continued surveillance/management of/for obstructive sleep apnea.    Medication Adjustments/Labs and Tests Ordered: Current medicines are reviewed at length with the patient today.  Concerns regarding medicines are outlined above.  Orders Placed This Encounter  Procedures  . CT CARDIAC SCORING  . Split night study   No orders of the defined types were placed in this encounter.   Patient Instructions  Medication Instructions:  Your physician recommends that you continue on your current medications as directed. Please refer to the Current Medication list given to you today.  *If you need a refill on your cardiac medications before your next appointment, please call your pharmacy*   Lab Work: None If you have labs (blood work) drawn today and your tests are completely normal, you will receive your results only by: Marland Kitchen MyChart Message (if you have MyChart) OR . A paper copy in the mail If you have any lab test that is abnormal or we need to change your treatment, we will call you to review the  results.   Testing/Procedures: Your physician recommends that you have a Calcium Score performed.  Your physician has recommended that you have a sleep study. This test records several body functions during sleep, including: brain activity, eye movement, oxygen and carbon dioxide blood levels, heart rate and rhythm, breathing rate and rhythm, the flow of air through your mouth and nose, snoring, body muscle movements, and chest and belly movement.    Follow-Up: At [facility], you and your health needs are our priority.  As part of our continuing mission to provide you with exceptional heart care, we have created designated Provider Care Teams.  These Care Teams include your primary Cardiologist (physician) and Advanced Practice Providers (APPs -  Physician Assistants and Nurse Practitioners) who all work together to provide you with the care you need, when you need it.  We recommend signing up for the patient portal called "MyChart".  Sign up information is provided on this After Visit Summary.  MyChart is used to connect with patients for Virtual Visits (Telemedicine).  Patients are able to view lab/test results, encounter notes, upcoming appointments, etc.  Non-urgent messages can be sent to your provider as well.   To learn more about what you can do with MyChart, go to [email or web address].    Your next appointment:   As needed  The format for your next appointment:   In Person  Provider:   You may see Dr. Verdis Prime or one of the following Advanced Practice Providers on your designated Care Team:    Norma Fredrickson, NP  Nada Boozer, NP  Georgie Chard, NP    Other Instructions      Signed, Lesleigh Noe, MD  05/19/2019 1:38 PM    [facility]

## 2019-05-19 ENCOUNTER — Other Ambulatory Visit: Payer: Self-pay

## 2019-05-19 ENCOUNTER — Encounter: Payer: Self-pay | Admitting: Interventional Cardiology

## 2019-05-19 ENCOUNTER — Telehealth: Payer: Self-pay | Admitting: *Deleted

## 2019-05-19 ENCOUNTER — Ambulatory Visit: Payer: BC Managed Care – PPO | Admitting: Interventional Cardiology

## 2019-05-19 ENCOUNTER — Telehealth: Payer: Self-pay | Admitting: Interventional Cardiology

## 2019-05-19 VITALS — BP 128/84 | HR 60 | Ht 74.0 in | Wt 216.0 lb

## 2019-05-19 DIAGNOSIS — R0602 Shortness of breath: Secondary | ICD-10-CM

## 2019-05-19 DIAGNOSIS — E785 Hyperlipidemia, unspecified: Secondary | ICD-10-CM | POA: Diagnosis not present

## 2019-05-19 DIAGNOSIS — R0683 Snoring: Secondary | ICD-10-CM

## 2019-05-19 DIAGNOSIS — R5383 Other fatigue: Secondary | ICD-10-CM

## 2019-05-19 DIAGNOSIS — N529 Male erectile dysfunction, unspecified: Secondary | ICD-10-CM

## 2019-05-19 DIAGNOSIS — Z8249 Family history of ischemic heart disease and other diseases of the circulatory system: Secondary | ICD-10-CM | POA: Diagnosis not present

## 2019-05-19 DIAGNOSIS — Z7189 Other specified counseling: Secondary | ICD-10-CM

## 2019-05-19 DIAGNOSIS — R002 Palpitations: Secondary | ICD-10-CM

## 2019-05-19 NOTE — Patient Instructions (Signed)
Medication Instructions:  Your physician recommends that you continue on your current medications as directed. Please refer to the Current Medication list given to you today.  *If you need a refill on your cardiac medications before your next appointment, please call your pharmacy*   Lab Work: None If you have labs (blood work) drawn today and your tests are completely normal, you will receive your results only by: Marland Kitchen MyChart Message (if you have MyChart) OR . A paper copy in the mail If you have any lab test that is abnormal or we need to change your treatment, we will call you to review the results.   Testing/Procedures: Your physician recommends that you have a Calcium Score performed.  Your physician has recommended that you have a sleep study. This test records several body functions during sleep, including: brain activity, eye movement, oxygen and carbon dioxide blood levels, heart rate and rhythm, breathing rate and rhythm, the flow of air through your mouth and nose, snoring, body muscle movements, and chest and belly movement.    Follow-Up: At [facility], you and your health needs are our priority.  As part of our continuing mission to provide you with exceptional heart care, we have created designated Provider Care Teams.  These Care Teams include your primary Cardiologist (physician) and Advanced Practice Providers (APPs -  Physician Assistants and Nurse Practitioners) who all work together to provide you with the care you need, when you need it.  We recommend signing up for the patient portal called "MyChart".  Sign up information is provided on this After Visit Summary.  MyChart is used to connect with patients for Virtual Visits (Telemedicine).  Patients are able to view lab/test results, encounter notes, upcoming appointments, etc.  Non-urgent messages can be sent to your provider as well.   To learn more about what you can do with MyChart, go to [email or web address].     Your next appointment:   As needed  The format for your next appointment:   In Person  Provider:   You may see Dr. Verdis Prime or one of the following Advanced Practice Providers on your designated Care Team:    Norma Fredrickson, NP  Nada Boozer, NP  Georgie Chard, NP    Other Instructions

## 2019-05-19 NOTE — Telephone Encounter (Signed)
-----   Message from Julio Sicks, RN sent at 05/19/2019 11:54 AM EDT ----- Sleep study ordered

## 2019-05-19 NOTE — Telephone Encounter (Signed)
   Went to chart to check who called pt. Transferred to [employer]

## 2019-05-19 NOTE — Telephone Encounter (Signed)
Spoke with pt and he was agreeable to come at 3pm on Tuesday to get an EKG prior to his CT.  Nurse visit scheduled.

## 2019-05-19 NOTE — Telephone Encounter (Signed)
Called and left message for pt to call back.    Pt was in the office today and EKG was not performed.  Pt coming for Calcium Score on 5/4 and Dr Katrinka Blazing would like for him to have an EKG that day.  Needs to be scheduled for a nurse visit at 3pm on 5/4. Calcium Score is scheduled for 3:30pm.

## 2019-05-24 ENCOUNTER — Other Ambulatory Visit: Payer: Self-pay

## 2019-05-24 ENCOUNTER — Ambulatory Visit (INDEPENDENT_AMBULATORY_CARE_PROVIDER_SITE_OTHER): Payer: BC Managed Care – PPO | Admitting: *Deleted

## 2019-05-24 ENCOUNTER — Ambulatory Visit (INDEPENDENT_AMBULATORY_CARE_PROVIDER_SITE_OTHER)
Admission: RE | Admit: 2019-05-24 | Discharge: 2019-05-24 | Disposition: A | Discharge status: Home / Self Care | Payer: BC Managed Care – PPO | Source: Ambulatory Visit | Attending: Interventional Cardiology | Admitting: Interventional Cardiology

## 2019-05-24 VITALS — HR 63 | Ht 74.0 in | Wt 216.0 lb

## 2019-05-24 DIAGNOSIS — R002 Palpitations: Secondary | ICD-10-CM | POA: Diagnosis not present

## 2019-05-24 DIAGNOSIS — E785 Hyperlipidemia, unspecified: Secondary | ICD-10-CM

## 2019-05-24 DIAGNOSIS — R0602 Shortness of breath: Secondary | ICD-10-CM

## 2019-05-24 DIAGNOSIS — Z8249 Family history of ischemic heart disease and other diseases of the circulatory system: Secondary | ICD-10-CM

## 2019-06-08 ENCOUNTER — Telehealth: Payer: Self-pay | Admitting: Interventional Cardiology

## 2019-06-08 NOTE — Telephone Encounter (Signed)
Pt called and wanted to speak to Coralee North to set up his sleep study. Please call the patient

## 2019-06-09 NOTE — Telephone Encounter (Signed)
Spoke to patient and informed him that his precert has not been completed and I will contact him as soon as it is. Pt is aware and agreeable to treatment.

## 2019-06-10 ENCOUNTER — Telehealth: Payer: Self-pay | Admitting: Interventional Cardiology

## 2019-06-10 NOTE — Telephone Encounter (Signed)
Did not need this encounter °

## 2019-06-16 NOTE — Patient Instructions (Signed)
Medication Instructions:  The current medical regimen is effective;  continue present plan and medications.  *If you need a refill on your cardiac medications before your next appointment, please call your pharmacy*  Follow-Up: At [facility], you and your health needs are our priority.  As part of our continuing mission to provide you with exceptional heart care, we have created designated Provider Care Teams.  These Care Teams include your primary Cardiologist (physician) and Advanced Practice Providers (APPs -  Physician Assistants and Nurse Practitioners) who all work together to provide you with the care you need, when you need it.  We recommend signing up for the patient portal called "MyChart".  Sign up information is provided on this After Visit Summary.  MyChart is used to connect with patients for Virtual Visits (Telemedicine).  Patients are able to view lab/test results, encounter notes, upcoming appointments, etc.  Non-urgent messages can be sent to your provider as well.   To learn more about what you can do with MyChart, go to [email or web address].    Your next appointment:   Follow up as scheduled with Dr Katrinka Blazing.

## 2019-07-05 ENCOUNTER — Telehealth: Payer: Self-pay | Admitting: *Deleted

## 2019-07-05 NOTE — Telephone Encounter (Signed)
Staff message sen to Coralee North ok to schedule sleep study. Per Janace Hoard [employer] states no PA is required.

## 2019-07-06 NOTE — Telephone Encounter (Signed)
Message Received: Rosine Door, Soundra Pilon, CMA  Reesa Chew, CMA Ok to schedule sleep study. Per Allen Derry said no PA is required.

## 2019-07-11 ENCOUNTER — Telehealth: Payer: Self-pay | Admitting: Interventional Cardiology

## 2019-07-11 ENCOUNTER — Other Ambulatory Visit (HOSPITAL_BASED_OUTPATIENT_CLINIC_OR_DEPARTMENT_OTHER): Payer: Self-pay

## 2019-07-11 NOTE — Telephone Encounter (Signed)
Patient is scheduled for lab study on 08/02/19. pt is scheduled for COVID screening on 07/29/19 3p prior to ss.  Patient understands his sleep study will be done at [facility] sleep lab. Patient understands she will receive a sleep packet in a week or so. Patient understands to call if she does not receive the sleep packet in a timely manner.  Left detailed message on voicemail with date and time of titration and informed patient to call back to confirm or reschedule.

## 2019-07-11 NOTE — Telephone Encounter (Signed)
Per [employer] no pre-auth needed for sleep, ref #[id number]

## 2019-07-19 ENCOUNTER — Ambulatory Visit: Payer: BC Managed Care – PPO | Admitting: Neurology

## 2019-07-19 ENCOUNTER — Encounter: Payer: Self-pay | Admitting: Neurology

## 2019-07-19 VITALS — BP 132/85 | HR 56 | Ht 74.0 in | Wt 214.3 lb

## 2019-07-19 DIAGNOSIS — G4719 Other hypersomnia: Secondary | ICD-10-CM | POA: Diagnosis not present

## 2019-07-19 DIAGNOSIS — R0683 Snoring: Secondary | ICD-10-CM

## 2019-07-19 NOTE — Progress Notes (Signed)
Subjective:    [name] ID: Mark [name] [name] is a 64 y.o. male.  HPI     Interim history:   Mark [name] [name] is a 64 year old right-handed gentleman with an underlying medical history of reflux disease, arthritis, recurrent headaches and borderline overweight state, who Presents for reevaluation of his sleep disturbance.  Mark [name] [name] is unaccompanied today.  I first met him at Mark [name] request of Dr. Jaynee Eagles on 10/15/2016, at which time he reported recurrent headaches, snoring and excessive daytime somnolence.  He was advised to proceed with sleep study testing.  He declined scheduling a home sleep test and also canceled a follow-up appointment with Dr. Jaynee Eagles in November 2018.   Today, 07/19/2019: He reports that he saw a cardiologist, who recommended he have a sleep study done, he is now scheduled next month. He reports snoring and sleep disruption, EDS. His ESS is 12/24.  Mark [name] [name]'s allergies, current medications, family history, past medical history, past social history, past surgical history and problem list were reviewed and updated as appropriate.   Previously:   10/15/16: (He) reports snoring and excessive daytime somnolence. I reviewed your office note from 10/13/2016. He has had a 10 month history of constant, dull, achy, mostly frontal HAs, treated for sinusitis repeatedly. Saw his eye doctor, got update glasses and has readers. Has occasional RLS symptoms, not bothersome.  Saw his dentist and ENT (Dr. Cresenciano Lick), had a CT sinuses.  His Epworth sleepiness score is 7 out of 24, fatigue score is 17 out of 63. He occasionally takes a 30 minute nap. He is retired from [employer] and is busy with other activities including spending time with his grandchildren. He drinks quite a bit of coffee about 20-24 ounces a day, typically no sodas or tea. He quit smoking 2-1/2 years ago, smoked for 40 years. Bedtime is around 10 and he likes to read for up to 30 minutes. Wake up time varies, sometimes as early as 59, he used to  wake up early for work. He denies morning headaches or night to night nocturia, snoring is typically on Mark [name] milder side per wife, he does not tend to twitch or tic in his sleep. He had MRI brain and MRA yesterday, results pending.  His Past Medical History Is Significant For: Past Medical History:  Diagnosis Date  . Allergies   . Arthritis   . Chronic headaches   . ED (erectile dysfunction)   . GERD (gastroesophageal reflux disease)   . H/O sebaceous cyst   . Plantar fasciitis     His Past Surgical History Is Significant For: Past Surgical History:  Procedure Laterality Date  . COLONOSCOPY W/ BIOPSIES AND POLYPECTOMY      His Family History Is Significant For: Family History  Problem Relation Age of Onset  . Heart disease Mother   . COPD Father     His Social History Is Significant For: Social History   Socioeconomic History  . Marital status: Married    Spouse name: Not on file  . Number of children: Not on file  . Years of education: Not on file  . Highest education level: Not on file  Occupational History  . Not on file  Tobacco Use  . Smoking status: Never Smoker  . Smokeless tobacco: Never Used  Vaping Use  . Vaping Use: Never used  Substance and Sexual Activity  . Alcohol use: No  . Drug use: No  . Sexual activity: Not on file  Other Topics Concern  . Not on  file  Social History Narrative  . Not on file   Social Determinants of Health   Financial Resource Strain:   . Difficulty of Paying Living Expenses:   Food Insecurity:   . Worried About Charity fundraiser in [name] Last Year:   . Arboriculturist in [name] Last Year:   Transportation Needs:   . Film/video editor (Medical):   Marland Kitchen Lack of Transportation (Non-Medical):   Physical Activity:   . Days of Exercise per Week:   . Minutes of Exercise per Session:   Stress:   . Feeling of Stress :   Social Connections:   . Frequency of Communication with Friends and Family:   . Frequency of Social  Gatherings with Friends and Family:   . Attends Religious Services:   . Active Member of Clubs or Organizations:   . Attends Archivist Meetings:   Marland Kitchen Marital Status:     His Allergies Are:  No Known Allergies:   His Current Medications Are:  Outpatient Encounter Medications as of 07/19/2019  Medication Sig  . acetaminophen (TYLENOL) 500 MG tablet Take 500 mg by mouth as needed.   . baclofen (LIORESAL) 10 MG tablet Take 5-10 mg by mouth as needed.   . cyanocobalamin (,VITAMIN B-12,) 1000 MCG/ML injection Inject into Mark [name] muscle.  . fluticasone (FLONASE) 50 MCG/ACT nasal spray Place into both nostrils daily.  . lansoprazole (PREVACID) 30 MG capsule TAKE ONE CAPSULE BY MOUTH DAILY AS NEEDED TO CONTROL ACID REFLUX SYMPTOMS  . meloxicam (MOBIC) 15 MG tablet Take one tablet daily with food as needed for arthritis pain.  . Omega-3 Fatty Acids (OMEGA 3 PO) Take by mouth.  . ranitidine (ZANTAC) 150 MG tablet Take 150 mg by mouth as needed.   . sildenafil (REVATIO) 20 MG tablet Take 2 to 4 tablets as needed for E.D.   Facility-Administered Encounter Medications as of 07/19/2019  Medication  . gadopentetate dimeglumine (MAGNEVIST) injection 19 mL  :  Review of Systems:  Out of a complete 14 point review of systems, all are reviewed and negative with Mark [name] exception of these symptoms as listed below: Review of Systems  Neurological:       Here for f/u. Pt reports he is scheduled for sleep study through his cardiology doctor of 07/25/2019 but waned to have neurologist opinion before proceeding with [place] [name] study.  Pt reports snoring and daytime sleepiness/fatigue are present.  Epworth Sleepiness Scale 0= would never doze 1= slight chance of dozing 2= moderate chance of dozing 3= high chance of dozing  Sitting and reading:2 Watching TV:2 Sitting inactive in a public place (ex. Theater or meeting):2 As a passenger in a car for an hour without a break:2 Lying down to rest in [name]  afternoon:2 Sitting and talking to someone:0 Sitting quietly after lunch (no alcohol):2 In a car, while stopped in traffic:0 Total:12     Objective:  Neurological Exam  Physical Exam Physical Examination:   Vitals:   07/19/19 1409  BP: 132/85  Pulse: (!) 56    General Examination: Mark [name] [name] is a very pleasant 64 y.o. male in no acute distress. He appears well-developed and well-nourished and well groomed.  I did not do a full examination today. I talked to Mark [name] [name] for about 10 min.   Assessment and Plan:  In summary, Mark [name] [name] is a very pleasant 16.-year old male  with an underlying medical history of reflux disease, arthritis, recurrent headaches and borderline overweight state,  who presents for follow up of his sleep disturbance. He did not pursue sleep study testing in 2018, but has decided to have sleep evaluation through his cardiologist. He is encouraged to keep his appointment for sleep testing and follow up with his cardiologist. He does not have a FU appointment with Dr. Jaynee Eagles at this time.  He was agreeable to pursuing sleep evaluation with his cardiologist and FU with them.

## 2019-07-19 NOTE — Patient Instructions (Signed)
As discussed, please keep your appointment for your sleep study through your cardiologist and follow up with them.

## 2019-07-25 ENCOUNTER — Other Ambulatory Visit: Payer: Self-pay

## 2019-07-25 ENCOUNTER — Ambulatory Visit: Payer: BC Managed Care – PPO | Attending: Interventional Cardiology | Admitting: Cardiology

## 2019-07-25 DIAGNOSIS — R5383 Other fatigue: Secondary | ICD-10-CM | POA: Diagnosis not present

## 2019-07-25 DIAGNOSIS — R0683 Snoring: Secondary | ICD-10-CM | POA: Insufficient documentation

## 2019-07-29 ENCOUNTER — Other Ambulatory Visit (HOSPITAL_COMMUNITY): Payer: BC Managed Care – PPO

## 2019-08-04 NOTE — Procedures (Signed)
    Patient Name: Donte, Lenzo Date: 07/25/2019 Gender: Male D.O.B: 03-30-1955 Age (years): 63 Referring Provider: Verdis Prime Height (inches): 74 Interpreting Physician: Armanda Magic MD, ABSM Weight (lbs): 205 RPSGT: Alfonso Ellis BMI: 26 MRN: [id number] Neck Size: 16.50  CLINICAL INFORMATION Sleep Study Type: NPSG  Indication for sleep study: N/A  Epworth Sleepiness Score: 12  SLEEP STUDY TECHNIQUE As per the AASM Manual for the Scoring of Sleep and Associated Events v2.3 (April 2016) with a hypopnea requiring 4% desaturations.  The channels recorded and monitored were frontal, central and occipital EEG, electrooculogram (EOG), submentalis EMG (chin), nasal and oral airflow, thoracic and abdominal wall motion, anterior tibialis EMG, snore microphone, electrocardiogram, and pulse oximetry.  MEDICATIONS Medications self-administered by patient taken the night of the study : N/A  SLEEP ARCHITECTURE The study was initiated at 10:02:38 PM and ended at 5:33:21 AM.  Sleep onset time was 16.8 minutes and the sleep efficiency was 77.0%. The total sleep time was 347 minutes.  Stage REM latency was 105.0 minutes.  The patient spent 5.9% of the night in stage N1 sleep, 59.4% in stage N2 sleep, 16.9% in stage N3 and 17.9% in REM.  Alpha intrusion was absent.  Supine sleep was 4.17%.  RESPIRATORY PARAMETERS The overall apnea/hypopnea index (AHI) was 3.3 per hour. There were 0 total apneas, including 0 obstructive, 0 central and 0 mixed apneas. There were 19 hypopneas and 0 RERAs.  The AHI during Stage REM sleep was 3.9 per hour.  AHI while supine was 49.7 per hour.  The mean oxygen saturation was 92.4%. The minimum SpO2 during sleep was 86.0%.  moderate snoring was noted during this study.  CARDIAC DATA The 2 lead EKG demonstrated sinus rhythm. The mean heart rate was 59.3 beats per minute. Other EKG findings include: None.  LEG MOVEMENT DATA The total PLMS were  10 with a resulting PLMS index of 1.7. Associated arousal with leg movement index was 0.5 .  IMPRESSIONS - No significant obstructive sleep apnea occurred during this study (AHI = 3.3/h). - No significant central sleep apnea occurred during this study (CAI = 0.0/h). - Mild oxygen desaturation was noted during this study (Min O2 = 86.0%). - The patient snored with moderate snoring volume. - No cardiac abnormalities were noted during this study. - Clinically significant periodic limb movements did not occur during sleep. No significant associated arousals.  DIAGNOSIS - Normal Study  RECOMMENDATIONS - Positional therapy avoiding supine position during sleep. - Avoid alcohol, sedatives and other CNS depressants that may worsen sleep apnea and disrupt normal sleep architecture. - Sleep hygiene should be reviewed to assess factors that may improve sleep quality. - Weight management and regular exercise should be initiated or continued if appropriate.  [Electronically signed] 08/04/2019 08:41 PM  Armanda Magic MD, ABSM Diplomate, American Board of Sleep Medicine

## 2019-08-05 ENCOUNTER — Telehealth: Payer: Self-pay | Admitting: *Deleted

## 2019-08-05 DIAGNOSIS — R0683 Snoring: Secondary | ICD-10-CM

## 2019-08-05 NOTE — Telephone Encounter (Signed)
-----   Message from Quintella Reichert, MD sent at 08/04/2019  8:47 PM EDT ----- Please let patient know that sleep study showed no significant sleep apnea.  He only has OSA when he is sleeping supine so needs to avoid sleeping supine

## 2019-08-05 NOTE — Telephone Encounter (Signed)
Informed patient of sleep study results and patient understanding was verbalized. Patient understands his sleep study showed no significant sleep apnea. He only has OSA when he is sleeping supine so needs to avoid sleeping supine. Pt is aware and agreeable to normal results. Patient agrees with treatment and thanked me for call.

## 2019-08-07 NOTE — Progress Notes (Signed)
Let him know, sleep apnea was not identified. Concentrate on good sleep habits.

## 2021-01-28 IMAGING — CT CT CARDIAC CORONARY ARTERY CALCIUM SCORE
3 series · 14 of 20 positions shown, 15 images · non-contrast
Comparison: None.
COMPARISON: None.

Addendum:
EXAM:
OVER-READ INTERPRETATION  CT CHEST

The following report is an over-read performed by radiologist Dr.
Mehar Reveles [[email or web address]] on 05/24/2019. This
over-read does not include interpretation of cardiac or coronary
anatomy or pathology. The coronary calcium score interpretation by
the cardiologist is attached.
CLINICAL DATA: Risk stratification 63 year old with family history
of CAD
Coronary Calcium Score
TECHNIQUE: The patient was scanned on a Siemens Force scanner. Axial
non-contrast 3 mm slices were carried out through the heart. The
data set was analyzed on a dedicated work station and scored using
the Agatson method.

[Series 2: casc 3.0 bv41 2 bestdiast 69 % · axial · 0.38mm/px · z∈[-254,-174]mm · 4 of 46 slices shown, 5 images]
[im 10/46  vessel]
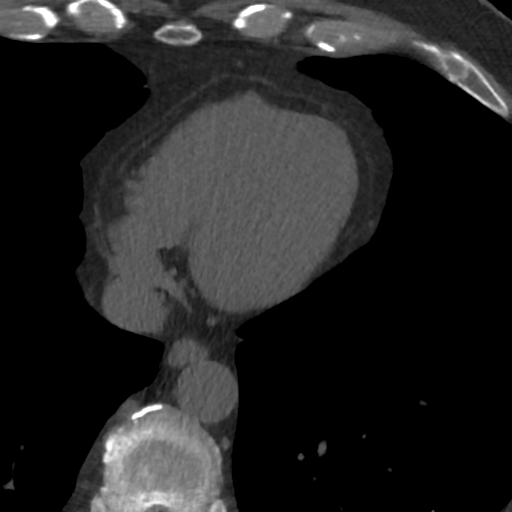
[im 10/46  lung]
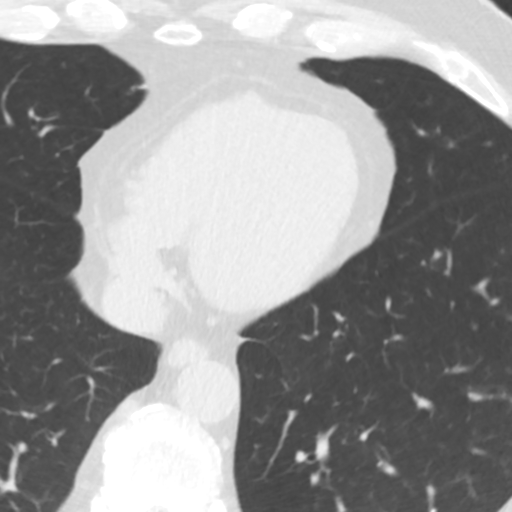
[im 19/46  vessel]
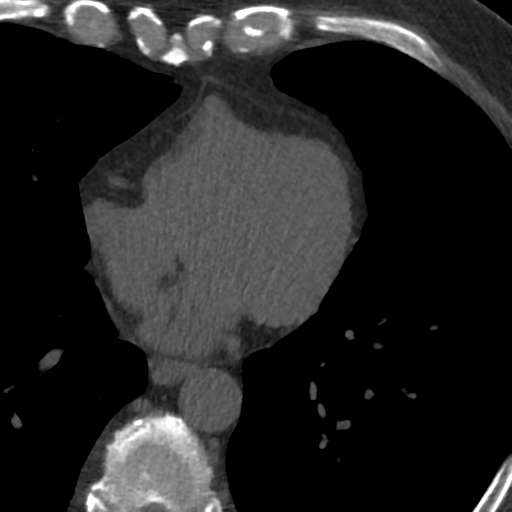
[im 28/46  vessel]
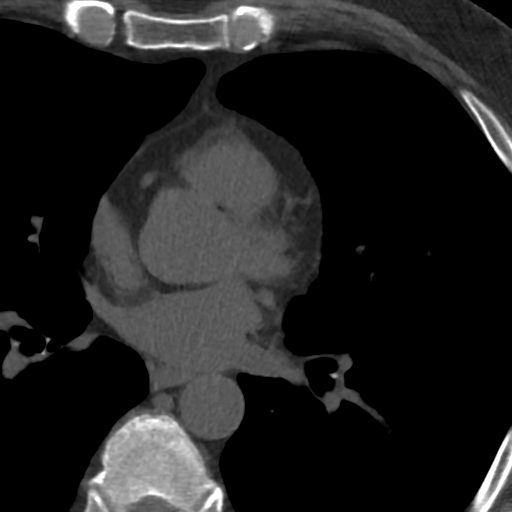
[im 37/46  vessel]
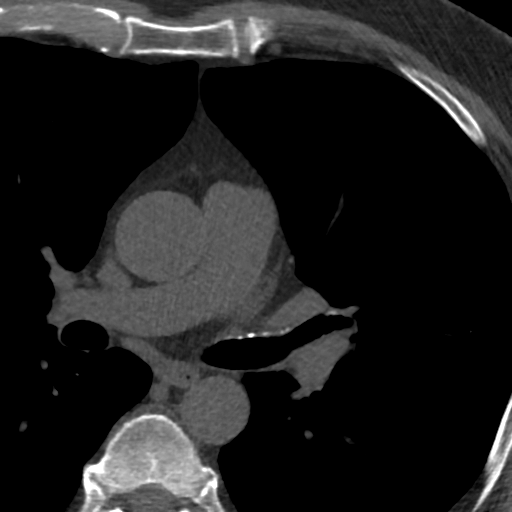

[Series 3: lung 72 % · axial · 0.74mm/px · z∈[-260,-170]mm · 5 of 46 slices shown]
[im 8/46  lung]
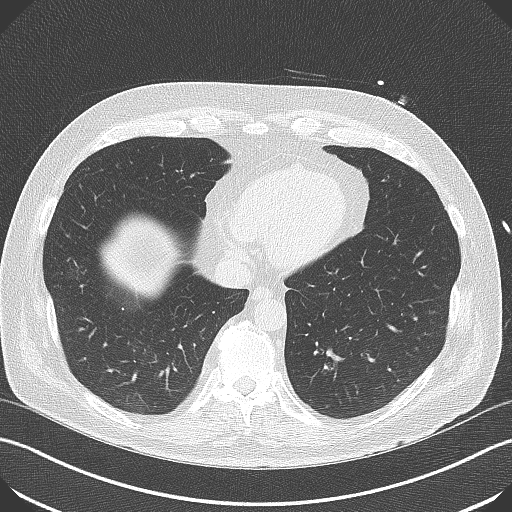
[im 16/46  lung]
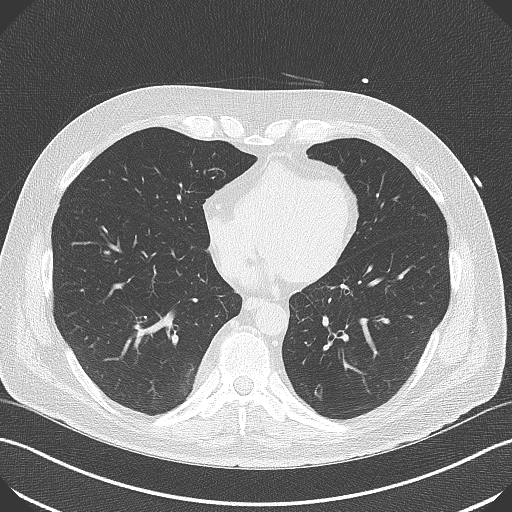
[im 23/46  lung]
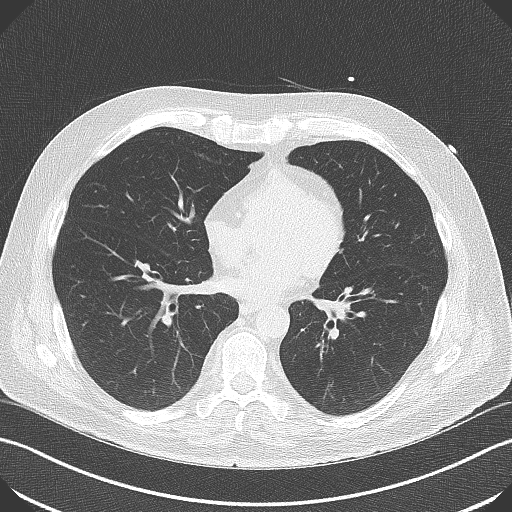
[im 31/46  lung]
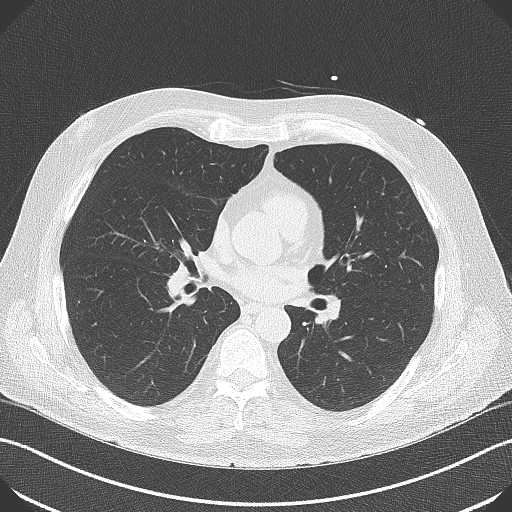
[im 38/46  lung]
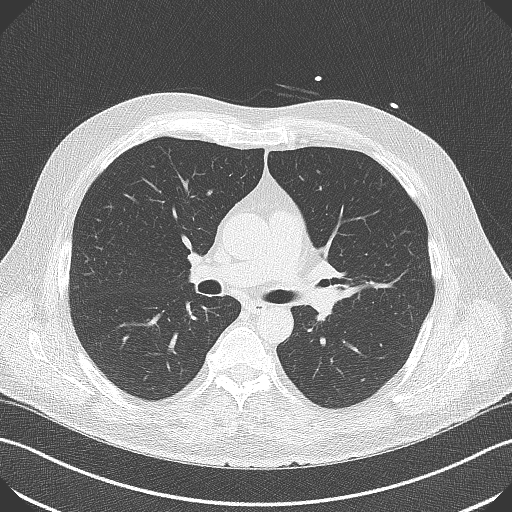

[Series 4: lung st 72 % · axial · 0.74mm/px · z∈[-260,-170]mm · 5 of 46 slices shown]
[im 8/46  lung]
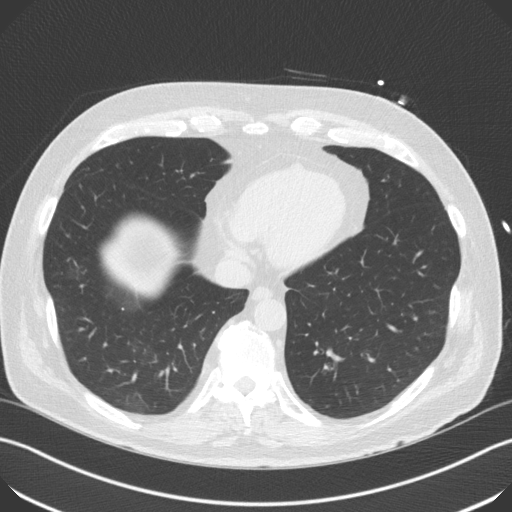
[im 16/46  lung]
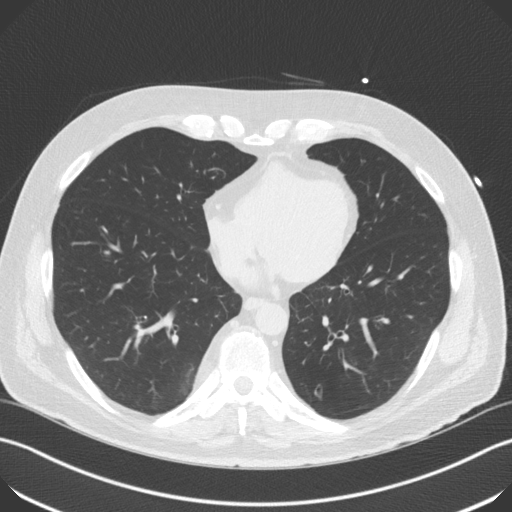
[im 23/46  lung]
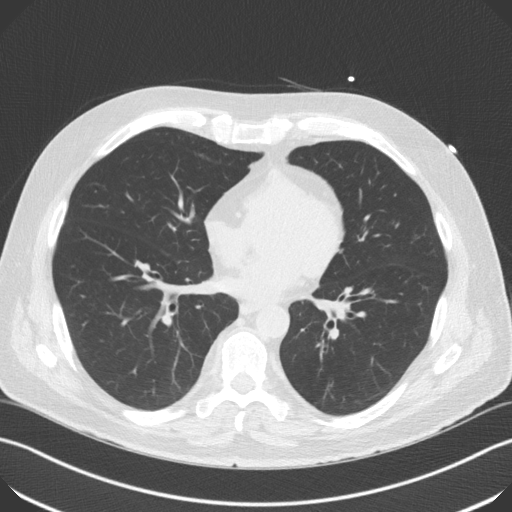
[im 31/46  lung]
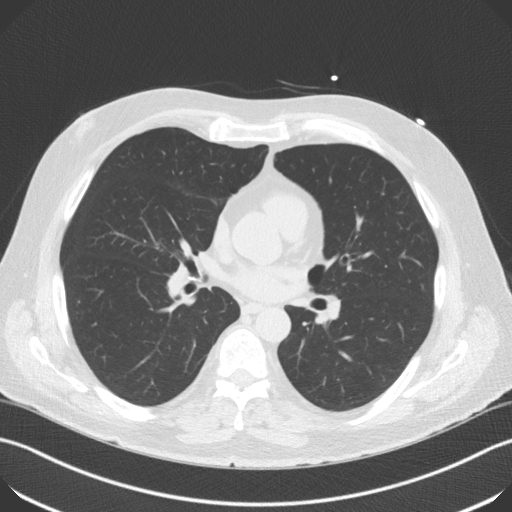
[im 38/46  lung]
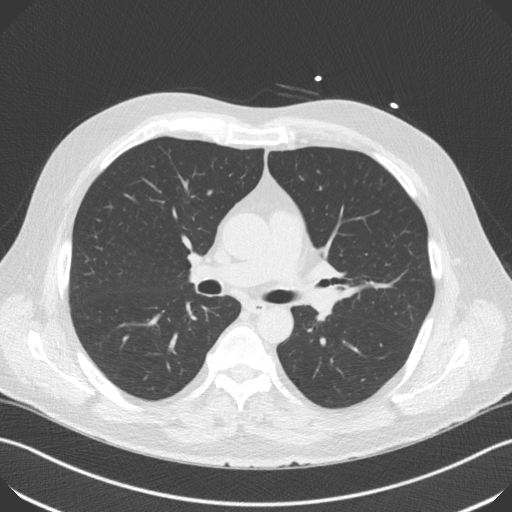

[14 of 20 positions shown; findings below may reference images not displayed]

FINDINGS: Multiple small calcified granulomas in the lungs bilaterally. There
is also a 5 x 3 mm (mean diameter 4 mm) left lower lobe pulmonary
nodule (axial image 34 of series 3). Within the visualized portions
of the thorax there are no other larger more suspicious appearing
pulmonary nodules or masses, there is no acute consolidative
airspace disease, no pleural effusions, no pneumothorax and no
lymphadenopathy. Visualized portions of the upper abdomen are
unremarkable. There are no aggressive appearing lytic or blastic
lesions noted in the visualized portions of the skeleton.
IMPRESSION: 1. 4 mm left lower lobe pulmonary nodule. This is nonspecific, but
statistically likely benign. No follow-up needed if patient is
low-risk. Non-contrast chest CT can be considered in 12 months if
patient is high-risk. This recommendation follows the consensus
statement: Guidelines for Management of Incidental Pulmonary Nodules
Detected on CT Images: From the [HOSPITAL] 7056; Radiology
FINDINGS: Non-cardiac: See separate report from [REDACTED].

Ascending Aorta: Normal

Pericardium: Normal

Coronary arteries: Normal
IMPRESSION: Coronary calcium score of 0. This was 0 percentile for age and sex
matched control.

*** End of Addendum ***
EXAM:
OVER-READ INTERPRETATION  CT CHEST

The following report is an over-read performed by radiologist Dr.
Mehar Reveles [REDACTED] on 05/24/2019. This
over-read does not include interpretation of cardiac or coronary
anatomy or pathology. The coronary calcium score interpretation by
the cardiologist is attached.
FINDINGS: Multiple small calcified granulomas in the lungs bilaterally. There
is also a 5 x 3 mm (mean diameter 4 mm) left lower lobe pulmonary
nodule (axial image 34 of series 3). Within the visualized portions
of the thorax there are no other larger more suspicious appearing
pulmonary nodules or masses, there is no acute consolidative
airspace disease, no pleural effusions, no pneumothorax and no
lymphadenopathy. Visualized portions of the upper abdomen are
unremarkable. There are no aggressive appearing lytic or blastic
lesions noted in the visualized portions of the skeleton.
IMPRESSION: 1. 4 mm left lower lobe pulmonary nodule. This is nonspecific, but
statistically likely benign. No follow-up needed if patient is
low-risk. Non-contrast chest CT can be considered in 12 months if
patient is high-risk. This recommendation follows the consensus
statement: Guidelines for Management of Incidental Pulmonary Nodules
Detected on CT Images: From the [HOSPITAL] 7056; Radiology

## 2021-10-08 ENCOUNTER — Other Ambulatory Visit: Payer: Self-pay | Admitting: Emergency Medicine

## 2021-10-08 DIAGNOSIS — Z122 Encounter for screening for malignant neoplasm of respiratory organs: Secondary | ICD-10-CM

## 2021-10-21 ENCOUNTER — Other Ambulatory Visit: Payer: BC Managed Care – PPO

## 2021-10-28 ENCOUNTER — Other Ambulatory Visit: Payer: Self-pay | Admitting: Emergency Medicine

## 2021-10-28 ENCOUNTER — Ambulatory Visit
Admission: RE | Admit: 2021-10-28 | Discharge: 2021-10-28 | Disposition: A | Discharge status: Home / Self Care | Payer: Medicare Other | Source: Ambulatory Visit | Attending: Emergency Medicine | Admitting: Emergency Medicine

## 2021-10-28 DIAGNOSIS — Z122 Encounter for screening for malignant neoplasm of respiratory organs: Secondary | ICD-10-CM

## 2021-12-06 ENCOUNTER — Other Ambulatory Visit: Payer: Self-pay

## 2021-12-06 DIAGNOSIS — I700 Atherosclerosis of aorta: Secondary | ICD-10-CM

## 2021-12-18 ENCOUNTER — Ambulatory Visit
Admission: RE | Admit: 2021-12-18 | Discharge: 2021-12-18 | Disposition: A | Discharge status: Home / Self Care | Payer: No Typology Code available for payment source | Source: Ambulatory Visit

## 2021-12-18 DIAGNOSIS — I700 Atherosclerosis of aorta: Secondary | ICD-10-CM

## 2021-12-23 ENCOUNTER — Other Ambulatory Visit: Payer: Self-pay

## 2021-12-23 ENCOUNTER — Other Ambulatory Visit: Payer: Self-pay | Admitting: Emergency Medicine

## 2021-12-23 DIAGNOSIS — I6523 Occlusion and stenosis of bilateral carotid arteries: Secondary | ICD-10-CM

## 2021-12-23 DIAGNOSIS — R9389 Abnormal findings on diagnostic imaging of other specified body structures: Secondary | ICD-10-CM

## 2022-01-23 ENCOUNTER — Other Ambulatory Visit: Payer: Medicare Other

## 2022-01-23 ENCOUNTER — Inpatient Hospital Stay: Admission: RE | Admit: 2022-01-23 | Payer: Medicare Other | Source: Ambulatory Visit

## 2022-01-29 ENCOUNTER — Other Ambulatory Visit: Payer: Self-pay | Admitting: Emergency Medicine

## 2022-01-29 ENCOUNTER — Ambulatory Visit
Admission: RE | Admit: 2022-01-29 | Discharge: 2022-01-29 | Disposition: A | Discharge status: Home / Self Care | Payer: Medicare Other | Source: Ambulatory Visit | Attending: Emergency Medicine | Admitting: Emergency Medicine

## 2022-01-29 DIAGNOSIS — R9389 Abnormal findings on diagnostic imaging of other specified body structures: Secondary | ICD-10-CM

## 2022-02-04 ENCOUNTER — Ambulatory Visit
Admission: RE | Admit: 2022-02-04 | Discharge: 2022-02-04 | Disposition: A | Discharge status: Home / Self Care | Payer: Medicare Other | Source: Ambulatory Visit

## 2022-02-04 DIAGNOSIS — I6523 Occlusion and stenosis of bilateral carotid arteries: Secondary | ICD-10-CM

## 2022-10-15 ENCOUNTER — Other Ambulatory Visit: Payer: Self-pay | Admitting: Family Medicine

## 2022-10-15 DIAGNOSIS — Z136 Encounter for screening for cardiovascular disorders: Secondary | ICD-10-CM

## 2022-10-15 DIAGNOSIS — Z122 Encounter for screening for malignant neoplasm of respiratory organs: Secondary | ICD-10-CM

## 2022-10-30 ENCOUNTER — Other Ambulatory Visit: Payer: Medicare Other

## 2023-01-07 ENCOUNTER — Ambulatory Visit: Payer: Self-pay | Admitting: Emergency Medicine

## 2023-01-07 DIAGNOSIS — G8929 Other chronic pain: Secondary | ICD-10-CM

## 2023-01-07 NOTE — H&P (Addendum)
UNICOMPARTMENTAL KNEE ADMISSION H&P  Patient is being admitted for right unicompartmental knee arthroplasty.  Subjective:  Chief Complaint:right knee pain.  HPI: Mark [name], 67 y.o. male, has a history of pain and functional disability in the right knee due to arthritis and has failed non-surgical conservative treatments for greater than 12 weeks to includeNSAID's and/or analgesics, corticosteriod injections, viscosupplementation injections, and activity modification.  Onset of symptoms was gradual, starting 8 years ago with gradually worsening course since that time. The patient noted no past surgery on the right knee(s).  Patient currently rates pain in the right knee(s) at 8 out of 10 with activity. Patient has night pain, worsening of pain with activity and weight bearing, pain that interferes with activities of daily living, and pain with passive range of motion.  Patient has evidence of periarticular osteophytes and joint space narrowing of the lateral compartment by imaging studies.  There is no active infection.  Patient Active Problem List   Diagnosis Date Noted   Non-allergic rhinitis 02/23/2018   Chronic intractable headache 10/13/2016   Past Medical History:  Diagnosis Date   Allergies    Arthritis    Chronic headaches    ED (erectile dysfunction)    GERD (gastroesophageal reflux disease)    H/O sebaceous cyst    Plantar fasciitis     Past Surgical History:  Procedure Laterality Date   COLONOSCOPY W/ BIOPSIES AND POLYPECTOMY      Current Outpatient Medications  Medication Sig Dispense Refill Last Dose/Taking   acetaminophen (TYLENOL) 500 MG tablet Take 500 mg by mouth as needed.       baclofen (LIORESAL) 10 MG tablet Take 5-10 mg by mouth as needed.       fluticasone (FLONASE) 50 MCG/ACT nasal spray Place into both nostrils daily.      lansoprazole (PREVACID) 30 MG capsule TAKE ONE CAPSULE BY MOUTH DAILY AS NEEDED TO CONTROL ACID REFLUX SYMPTOMS      meloxicam  (MOBIC) 15 MG tablet Take one tablet daily with food as needed for arthritis pain.      Omega-3 Fatty Acids (OMEGA 3 PO) Take by mouth.      ranitidine (ZANTAC) 150 MG tablet Take 150 mg by mouth as needed.       sildenafil (REVATIO) 20 MG tablet Take 2 to 4 tablets as needed for E.D.      No current facility-administered medications for this visit.   Facility-Administered Medications Ordered in Other Visits  Medication Dose Route Frequency Provider Last Rate Last Admin   gadopentetate dimeglumine (MAGNEVIST) injection 19 mL  19 mL Intravenous Once PRN Anson Fret, MD       No Known Allergies  Social History   Tobacco Use   Smoking status: Never   Smokeless tobacco: Never  Substance Use Topics   Alcohol use: No    Family History  Problem Relation Age of Onset   Heart disease Mother    COPD Father      Review of Systems  Musculoskeletal:  Positive for arthralgias.  All other systems reviewed and are negative.   Objective:  Physical Exam Constitutional:      General: He is not in acute distress.    Appearance: Normal appearance. He is normal weight.  HENT:     Head: Normocephalic and atraumatic.  Eyes:     Extraocular Movements: Extraocular movements intact.     Conjunctiva/sclera: Conjunctivae normal.     Pupils: Pupils are equal, round, and reactive to light.  Cardiovascular:     Rate and Rhythm: Normal rate and regular rhythm.     Pulses: Normal pulses.     Heart sounds: Normal heart sounds.  Pulmonary:     Effort: Pulmonary effort is normal. No respiratory distress.     Breath sounds: Normal breath sounds.  Abdominal:     General: Bowel sounds are normal. There is no distension.     Palpations: Abdomen is soft.     Tenderness: There is no abdominal tenderness.  Musculoskeletal:        General: Tenderness present.     Cervical back: Normal range of motion and neck supple.     Comments: TTP over lateral joint line.  No calf tenderness, swelling, or  erythema.  No overlying lesions of area of chief complaint.  Decreased strength and ROM due to elicited pain.  Pre-operative ROM 0-125.  Dorsiflexion and plantarflexion intact.  Stable to varus and valgus stress.  BLE appear grossly neurovascularly intact.  Gait mildly antalgic.   Lymphadenopathy:     Cervical: No cervical adenopathy.  Skin:    General: Skin is warm and dry.     Capillary Refill: Capillary refill takes less than 2 seconds.     Findings: No erythema or rash.  Neurological:     General: No focal deficit present.     Mental Status: He is alert and oriented to person, place, and time.  Psychiatric:        Mood and Affect: Mood normal.        Behavior: Behavior normal.     Vital signs in last 24 hours: @VSRANGES @  Labs:   Estimated body mass index is 27.52 kg/m as calculated from the following:   Height as of 07/19/19: 6\' 2"  (1.88 m).   Weight as of 07/19/19: 97.2 kg.   Imaging Review Plain radiographs demonstrate severe degenerative joint disease of the right knee(s), appearing isolated to the lateral compartment. The overall alignment issignificant valgus. The bone quality appears to be fair for age and reported activity level.      Assessment/Plan:  End stage arthritis, right knee, lateral compartment  The patient history, physical examination, clinical judgment of the provider and imaging studies are consistent with end stage degenerative joint disease of the right knee(s) in the lateral compartment and unicompartmental knee arthroplasty is deemed medically necessary. The treatment options including medical management, injection therapy arthroscopy and arthroplasty were discussed at length. The risks and benefits of total knee arthroplasty were presented and reviewed. The risks due to aseptic loosening, infection, stiffness, patella tracking problems, thromboembolic complications and other imponderables were discussed. Also discussed possibility of transitioning  from unicompartmental to total knee arthroplasty dependent on surgical findings.  The patient acknowledged the explanation, agreed to proceed with the plan and consent was signed. Patient is being admitted for inpatient treatment for surgery, pain control, PT, OT, prophylactic antibiotics, VTE prophylaxis, progressive ambulation and ADL's and discharge planning. The patient is planning to be discharged home with outpatient PT.     Patient's anticipated LOS is less than 2 midnights, meeting these requirements: - Younger than 84 - Lives within 1 hour of care - Has a competent adult at home to recover with post-op recover - NO history of  - Chronic pain requiring opiods  - Diabetes  - Coronary Artery Disease  - Heart failure  - Heart attack  - Stroke  - DVT/VTE  - Cardiac arrhythmia  - Respiratory Failure/COPD  - Renal failure  - Anemia  -  Advanced Liver disease

## 2023-01-15 NOTE — Progress Notes (Addendum)
COVID Vaccine Completed: yes  Date of COVID positive in last 90 days:  PCP - Benedetto Goad, MD- changing to [facility] Cardiologist - Verdis Prime, MD LOV 05/19/19 Pulmonologist- [place]  CT- 01/29/22 Epic Chest x-ray - n/a EKG - 10/09/22 [facility] Stress Test - n/a ECHO - n/a Cardiac Cath - n/a Pacemaker/ICD device last checked: n/a Spinal Cord Stimulator: n/a  Bowel Prep - no  Sleep Study - yes, negative CPAP -   Fasting Blood Sugar - n/a Checks Blood Sugar _____ times a day  Last dose of GLP1 agonist-  N/A GLP1 instructions:  Hold 7 days before surgery    Last dose of SGLT-2 inhibitors-  N/A SGLT-2 instructions:  Hold 3 days before surgery    Blood Thinner Instructions: n/a Aspirin Instructions: Last Dose:  Activity level: Can go up a flight of stairs and perform activities of daily living without stopping and without symptoms of chest pain or shortness of breath.  Anesthesia review:   Patient denies shortness of breath, fever, cough and chest pain at PAT appointment  Patient verbalized understanding of instructions that were given to them at the PAT appointment. Patient was also instructed that they will need to review over the PAT instructions again at home before surgery.

## 2023-01-15 NOTE — Patient Instructions (Addendum)
SURGICAL WAITING ROOM VISITATION  Patients having surgery or a procedure may have no more than 2 support people in the waiting area - these visitors may rotate.    Children under the age of 46 must have an adult with them who is not the patient.  Due to an increase in RSV and influenza rates and associated hospitalizations, children ages 40 and under may not visit patients in [facility] hospitals.  If the patient needs to stay at the hospital during part of their recovery, the visitor guidelines for inpatient rooms apply. Pre-op nurse will coordinate an appropriate time for 1 support person to accompany patient in pre-op.  This support person may not rotate.    Please refer to the [facility] website for the visitor guidelines for Inpatients (after your surgery is over and you are in a regular room).    Your procedure is scheduled on: 01/27/22   Report to [facility] Main Entrance    Report to admitting at 6:00 AM   Call this number if you have problems the morning of surgery [phone]   Do not eat food :After Midnight.   After Midnight you may have the following liquids until 5:30 AM DAY OF SURGERY  Water Non-Citrus Juices (without pulp, NO RED-Apple, White grape, White cranberry) Black Coffee (NO MILK/CREAM OR CREAMERS, sugar ok)  Clear Tea (NO MILK/CREAM OR CREAMERS, sugar ok) regular and decaf                             Plain Jell-O (NO RED)                                           Fruit ices (not with fruit pulp, NO RED)                                     Popsicles (NO RED)                                                               Sports drinks like Gatorade (NO RED)     The day of surgery:  Drink ONE (1) Pre-Surgery Clear Ensure at 5:30 AM the morning of surgery. Drink in one sitting. Do not sip.  This drink was given to you during your hospital  pre-op appointment visit. Nothing else to drink after completing the  Pre-Surgery Clear Ensure.          If  you have questions, please contact your surgeon's office.   FOLLOW BOWEL PREP AND ANY ADDITIONAL PRE OP INSTRUCTIONS YOU RECEIVED FROM YOUR SURGEON'S OFFICE!!!     Oral Hygiene is also important to reduce your risk of infection.                                    Remember - BRUSH YOUR TEETH THE MORNING OF SURGERY WITH YOUR REGULAR TOOTHPASTE  DENTURES WILL BE REMOVED PRIOR TO SURGERY PLEASE DO NOT APPLY "Poly grip" OR  ADHESIVES!!!   Stop all vitamins and herbal supplements 7 days before surgery.   Take these medicines the morning of surgery with A SIP OF WATER: Tylenol, Nexium, Flonase                              You may not have any metal on your body including jewelry, and body piercing             Do not wear lotions, powders, cologne, or deodorant              Men may shave face and neck.   Do not bring valuables to the hospital. [name] IS NOT             RESPONSIBLE   FOR VALUABLES.   Contacts, glasses, dentures or bridgework may not be worn into surgery.  DO NOT BRING YOUR HOME MEDICATIONS TO THE HOSPITAL. PHARMACY WILL DISPENSE MEDICATIONS LISTED ON YOUR MEDICATION LIST TO YOU DURING YOUR ADMISSION IN THE HOSPITAL!    Patients discharged on the day of surgery will not be allowed to drive home.  Someone NEEDS to stay with you for the first 24 hours after anesthesia.   Special Instructions: Bring a copy of your healthcare power of attorney and living will documents the day of surgery if you haven't scanned them before.              Please read over the following fact sheets you were given: IF YOU HAVE QUESTIONS ABOUT YOUR PRE-OP INSTRUCTIONS PLEASE CALL [name]    If you received a COVID test during your pre-op visit  it is requested that you wear a mask when out in public, stay away from anyone that may not be feeling well and notify your surgeon if you develop symptoms. If you test positive for Covid or have been in contact with anyone that has tested  positive in the last 10 days please notify you surgeon.      Pre-operative 5 CHG Bath Instructions   You can play a key role in reducing the risk of infection after surgery. Your skin needs to be as free of germs as possible. You can reduce the number of germs on your skin by washing with CHG (chlorhexidine gluconate) soap before surgery. CHG is an antiseptic soap that kills germs and continues to kill germs even after washing.   DO NOT use if you have an allergy to chlorhexidine/CHG or antibacterial soaps. If your skin becomes reddened or irritated, stop using the CHG and notify one of our RNs at [phone].   Please shower with the CHG soap starting 4 days before surgery using the following schedule:     Please keep in mind the following:  DO NOT shave, including legs and underarms, starting the day of your first shower.   You may shave your face at any point before/day of surgery.  Place clean sheets on your bed the day you start using CHG soap. Use a clean washcloth (not used since being washed) for each shower. DO NOT sleep with pets once you start using the CHG.   CHG Shower Instructions:  If you choose to wash your hair and private area, wash first with your normal shampoo/soap.  After you use shampoo/soap, rinse your hair and body thoroughly to remove shampoo/soap residue.  Turn the water OFF and apply about 3 tablespoons (45 ml) of CHG soap to a CLEAN washcloth.  Apply CHG soap ONLY FROM YOUR NECK DOWN TO YOUR TOES (washing for 3-5 minutes)  DO NOT use CHG soap on face, private areas, open wounds, or sores.  Pay special attention to the area where your surgery is being performed.  If you are having back surgery, having someone wash your back for you may be helpful. Wait 2 minutes after CHG soap is applied, then you may rinse off the CHG soap.  Pat dry with a clean towel  Put on clean clothes/pajamas   If you choose to wear lotion, please use ONLY the CHG-compatible lotions  on the back of this paper.     Additional instructions for the day of surgery: DO NOT APPLY any lotions, deodorants, cologne, or perfumes.   Put on clean/comfortable clothes.  Brush your teeth.  Ask your nurse before applying any prescription medications to the skin.      CHG Compatible Lotions   Aveeno Moisturizing lotion  Cetaphil Moisturizing Cream  Cetaphil Moisturizing Lotion  Clairol Herbal Essence Moisturizing Lotion, Dry Skin  Clairol Herbal Essence Moisturizing Lotion, Extra Dry Skin  Clairol Herbal Essence Moisturizing Lotion, Normal Skin  Curel Age Defying Therapeutic Moisturizing Lotion with Alpha Hydroxy  Curel Extreme Care Body Lotion  Curel Soothing Hands Moisturizing Hand Lotion  Curel Therapeutic Moisturizing Cream, Fragrance-Free  Curel Therapeutic Moisturizing Lotion, Fragrance-Free  Curel Therapeutic Moisturizing Lotion, Original Formula  Eucerin Daily Replenishing Lotion  Eucerin Dry Skin Therapy Plus Alpha Hydroxy Crme  Eucerin Dry Skin Therapy Plus Alpha Hydroxy Lotion  Eucerin Original Crme  Eucerin Original Lotion  Eucerin Plus Crme Eucerin Plus Lotion  Eucerin TriLipid Replenishing Lotion  Keri Anti-Bacterial Hand Lotion  Keri Deep Conditioning Original Lotion Dry Skin Formula Softly Scented  Keri Deep Conditioning Original Lotion, Fragrance Free Sensitive Skin Formula  Keri Lotion Fast Absorbing Fragrance Free Sensitive Skin Formula  Keri Lotion Fast Absorbing Softly Scented Dry Skin Formula  Keri Original Lotion  Keri Skin Renewal Lotion Keri Silky Smooth Lotion  Keri Silky Smooth Sensitive Skin Lotion  Nivea Body Creamy Conditioning Oil  Nivea Body Extra Enriched Teacher, adult education Moisturizing Lotion Nivea Crme  Nivea Skin Firming Lotion  NutraDerm 30 Skin Lotion  NutraDerm Skin Lotion  NutraDerm Therapeutic Skin Cream  NutraDerm Therapeutic Skin Lotion  ProShield Protective Hand Cream  Provon  moisturizing lotion  WHAT IS A BLOOD TRANSFUSION? Blood Transfusion Information  A transfusion is the replacement of blood or some of its parts. Blood is made up of multiple cells which provide different functions. Red blood cells carry oxygen and are used for blood loss replacement. White blood cells fight against infection. Platelets control bleeding. Plasma helps clot blood. Other blood products are available for specialized needs, such as hemophilia or other clotting disorders. BEFORE THE TRANSFUSION  Who gives blood for transfusions?  Healthy volunteers who are fully evaluated to make sure their blood is safe. This is blood bank blood. Transfusion therapy is the safest it has ever been in the practice of medicine. Before blood is taken from a donor, a complete history is taken to make sure that person has no history of diseases nor engages in risky social behavior (examples are intravenous drug use or sexual activity with multiple partners). The donor's travel history is screened to minimize risk of transmitting infections, such as malaria. The donated blood is tested for signs of infectious diseases, such as HIV and hepatitis. The blood is then tested to be sure it  is compatible with you in order to minimize the chance of a transfusion reaction. If you or a relative donates blood, this is often done in anticipation of surgery and is not appropriate for emergency situations. It takes many days to process the donated blood. RISKS AND COMPLICATIONS Although transfusion therapy is very safe and saves many lives, the main dangers of transfusion include:  Getting an infectious disease. Developing a transfusion reaction. This is an allergic reaction to something in the blood you were given. Every precaution is taken to prevent this. The decision to have a blood transfusion has been considered carefully by your caregiver before blood is given. Blood is not given unless the benefits outweigh the  risks. AFTER THE TRANSFUSION Right after receiving a blood transfusion, you will usually feel much better and more energetic. This is especially true if your red blood cells have gotten low (anemic). The transfusion raises the level of the red blood cells which carry oxygen, and this usually causes an energy increase. The nurse administering the transfusion will monitor you carefully for complications. HOME CARE INSTRUCTIONS  No special instructions are needed after a transfusion. You may find your energy is better. Speak with your caregiver about any limitations on activity for underlying diseases you may have. SEEK MEDICAL CARE IF:  Your condition is not improving after your transfusion. You develop redness or irritation at the intravenous (IV) site. SEEK IMMEDIATE MEDICAL CARE IF:  Any of the following symptoms occur over the next 12 hours: Shaking chills. You have a temperature by mouth above 102 F (38.9 C), not controlled by medicine. Chest, back, or muscle pain. People around you feel you are not acting correctly or are confused. Shortness of breath or difficulty breathing. Dizziness and fainting. You get a rash or develop hives. You have a decrease in urine output. Your urine turns a dark color or changes to pink, red, or brown. Any of the following symptoms occur over the next 10 days: You have a temperature by mouth above 102 F (38.9 C), not controlled by medicine. Shortness of breath. Weakness after normal activity. The white part of the eye turns yellow (jaundice). You have a decrease in the amount of urine or are urinating less often. Your urine turns a dark color or changes to pink, red, or brown. Document Released: 01/04/2000 Document Revised: 03/31/2011 Document Reviewed: 08/23/2007 Exit[employer] Patient Information 2014 [place], [place].  _______________________________________________________________________  Incentive Spirometer  An incentive spirometer is a tool  that can help keep your lungs clear and active. This tool measures how well you are filling your lungs with each breath. Taking long deep breaths may help reverse or decrease the chance of developing breathing (pulmonary) problems (especially infection) following: A long period of time when you are unable to move or be active. BEFORE THE PROCEDURE  If the spirometer includes an indicator to show your best effort, your nurse or respiratory therapist will set it to a desired goal. If possible, sit up straight or lean slightly forward. Try not to slouch. Hold the incentive spirometer in an upright position. INSTRUCTIONS FOR USE  Sit on the edge of your bed if possible, or sit up as far as you can in bed or on a chair. Hold the incentive spirometer in an upright position. Breathe out normally. Place the mouthpiece in your mouth and seal your lips tightly around it. Breathe in slowly and as deeply as possible, raising the piston or the ball toward the top of the column. Hold  your breath for 3-5 seconds or for as long as possible. Allow the piston or ball to fall to the bottom of the column. Remove the mouthpiece from your mouth and breathe out normally. Rest for a few seconds and repeat Steps 1 through 7 at least 10 times every 1-2 hours when you are awake. Take your time and take a few normal breaths between deep breaths. The spirometer may include an indicator to show your best effort. Use the indicator as a goal to work toward during each repetition. After each set of 10 deep breaths, practice coughing to be sure your lungs are clear. If you have an incision (the cut made at the time of surgery), support your incision when coughing by placing a pillow or rolled up towels firmly against it. Once you are able to get out of bed, walk around indoors and cough well. You may stop using the incentive spirometer when instructed by your caregiver.  RISKS AND COMPLICATIONS Take your time so you do not get  dizzy or light-headed. If you are in pain, you may need to take or ask for pain medication before doing incentive spirometry. It is harder to take a deep breath if you are having pain. AFTER USE Rest and breathe slowly and easily. It can be helpful to keep track of a log of your progress. Your caregiver can provide you with a simple table to help with this. If you are using the spirometer at home, follow these instructions: SEEK MEDICAL CARE IF:  You are having difficultly using the spirometer. You have trouble using the spirometer as often as instructed. Your pain medication is not giving enough relief while using the spirometer. You develop fever of 100.5 F (38.1 C) or higher. SEEK IMMEDIATE MEDICAL CARE IF:  You cough up bloody sputum that had not been present before. You develop fever of 102 F (38.9 C) or greater. You develop worsening pain at or near the incision site. MAKE SURE YOU:  Understand these instructions. Will watch your condition. Will get help right away if you are not doing well or get worse. Document Released: 05/19/2006 Document Revised: 03/31/2011 Document Reviewed: 07/20/2006 [facility] Patient Information 2014 [place], [place].   ________________________________________________________________________

## 2023-01-16 ENCOUNTER — Encounter (HOSPITAL_COMMUNITY)
Admission: RE | Admit: 2023-01-16 | Discharge: 2023-01-16 | Disposition: A | Discharge status: Home / Self Care | Payer: Medicare Other | Source: Ambulatory Visit | Attending: Orthopedic Surgery | Admitting: Orthopedic Surgery

## 2023-01-16 ENCOUNTER — Encounter (HOSPITAL_COMMUNITY): Payer: Self-pay

## 2023-01-16 ENCOUNTER — Other Ambulatory Visit: Payer: Self-pay

## 2023-01-16 VITALS — BP 130/86 | HR 56 | Temp 97.8°F | Resp 16 | Ht 74.0 in | Wt 211.0 lb

## 2023-01-16 DIAGNOSIS — G8929 Other chronic pain: Secondary | ICD-10-CM | POA: Diagnosis not present

## 2023-01-16 DIAGNOSIS — Z01818 Encounter for other preprocedural examination: Secondary | ICD-10-CM

## 2023-01-16 DIAGNOSIS — Z01812 Encounter for preprocedural laboratory examination: Secondary | ICD-10-CM | POA: Insufficient documentation

## 2023-01-16 DIAGNOSIS — M25561 Pain in right knee: Secondary | ICD-10-CM | POA: Insufficient documentation

## 2023-01-16 HISTORY — DX: Chronic obstructive pulmonary disease, unspecified: J44.9

## 2023-01-16 LAB — COMPREHENSIVE METABOLIC PANEL
ALT: 21 U/L (ref 0–44)
AST: 18 U/L (ref 15–41)
Albumin: 3.9 g/dL (ref 3.5–5.0)
Alkaline Phosphatase: 44 U/L (ref 38–126)
Anion gap: 6 (ref 5–15)
BUN: 16 mg/dL (ref 8–23)
CO2: 25 mmol/L (ref 22–32)
Calcium: 9 mg/dL (ref 8.9–10.3)
Chloride: 106 mmol/L (ref 98–111)
Creatinine, Ser: 0.85 mg/dL (ref 0.61–1.24)
GFR, Estimated: >60 mL/min (ref >60)
Glucose, Bld: 86 mg/dL (ref 70–99)
Potassium: 4.6 mmol/L (ref 3.5–5.1)
Sodium: 137 mmol/L (ref 135–145)
Total Bilirubin: 0.7 mg/dL (ref <1.2)
Total Protein: 6.5 g/dL (ref 6.5–8.1)

## 2023-01-16 LAB — CBC WITH DIFFERENTIAL/PLATELET
Abs Immature Granulocytes: 0.01 10*3/uL (ref 0.00–0.07)
Basophils Absolute: 0 10*3/uL (ref 0.0–0.1)
Basophils Relative: 1 %
Eosinophils Absolute: 0.2 10*3/uL (ref 0.0–0.5)
Eosinophils Relative: 3 %
HCT: 47.5 % (ref 39.0–52.0)
Hemoglobin: 16.4 g/dL (ref 13.0–17.0)
Immature Granulocytes: 0 %
Lymphocytes Relative: 37 %
Lymphs Abs: 2 10*3/uL (ref 0.7–4.0)
MCH: 31.4 pg (ref 26.0–34.0)
MCHC: 34.5 g/dL (ref 30.0–36.0)
MCV: 91 fL (ref 80.0–100.0)
Monocytes Absolute: 0.5 10*3/uL (ref 0.1–1.0)
Monocytes Relative: 9 %
Neutro Abs: 2.8 10*3/uL (ref 1.7–7.7)
Neutrophils Relative %: 50 %
Platelets: 189 10*3/uL (ref 150–400)
RBC: 5.22 MIL/uL (ref 4.22–5.81)
RDW: 13.6 % (ref 11.5–15.5)
WBC: 5.5 10*3/uL (ref 4.0–10.5)
nRBC: 0 % (ref 0.0–0.2)

## 2023-01-16 LAB — TYPE AND SCREEN
ABO/RH(D): O NEG
Antibody Screen: NEGATIVE

## 2023-01-16 LAB — SURGICAL PCR SCREEN
MRSA, PCR: NEGATIVE
Staphylococcus aureus: POSITIVE — AB

## 2023-01-27 NOTE — Progress Notes (Addendum)
 I was made aware of patients symptoms, Should with Dr Blanchie Dessert who stated he will call patient to discuss.   1:09 PM Dr Blanchie Dessert returned call patient will be postponed and rescheduled for a later date.

## 2023-01-27 NOTE — Progress Notes (Signed)
 Patient called stating he is having sinus congestion/drainage and a temp of 99.16F. He has already left a Engineer, technical sales with Tresa Endo at [employer]. I also called and left a voicemail. He is scheduled for TKA 01/28/23. Will wait for update.

## 2023-01-29 NOTE — Progress Notes (Signed)
 PT called on 01/29/23 and pt instructed surgery on 02/04/23 at 1114am with arrival time of 0845am.  PT aware liquids until 0800am with completion of Ensure drink by 0800am.  Shower daily with hibiclens  starting on  01/31/23 and on morning of surgery .  Otherwise to follow same preop instrucitons.  Pt voiced understanding.

## 2023-02-02 ENCOUNTER — Ambulatory Visit: Payer: Medicare Other | Admitting: Physical Therapy

## 2023-02-03 NOTE — Anesthesia Preprocedure Evaluation (Addendum)
 Anesthesia Evaluation  Patient identified by MRN, date of birth, ID band Patient awake    Reviewed: Allergy & Precautions, H&P , NPO status , Patient's Chart, lab work & pertinent test results  Airway Mallampati: II  TM Distance: >3 FB Neck ROM: Full    Dental no notable dental hx. (+) Teeth Intact, Dental Advisory Given   Pulmonary COPD   Pulmonary exam normal breath sounds clear to auscultation       Cardiovascular Exercise Tolerance: Good negative cardio ROS  Rhythm:Regular Rate:Normal     Neuro/Psych  Headaches  negative psych ROS   GI/Hepatic Neg liver ROS,GERD  Medicated,,  Endo/Other  negative endocrine ROS    Renal/GU negative Renal ROS  negative genitourinary   Musculoskeletal  (+) Arthritis , Osteoarthritis,    Abdominal   Peds  Hematology negative hematology ROS (+)   Anesthesia Other Findings   Reproductive/Obstetrics negative OB ROS                             Anesthesia Physical Anesthesia Plan  ASA: 2  Anesthesia Plan: Spinal   Post-op Pain Management: Tylenol  PO (pre-op)*   Induction: Intravenous  PONV Risk Score and Plan: 2 and Ondansetron , Propofol  infusion and Dexamethasone   Airway Management Planned: Natural Airway and Simple Face Mask  Additional Equipment:   Intra-op Plan:   Post-operative Plan:   Informed Consent: I have reviewed the patients History and Physical, chart, labs and discussed the procedure including the risks, benefits and alternatives for the proposed anesthesia with the patient or authorized representative who has indicated his/her understanding and acceptance.     Dental advisory given  Plan Discussed with: CRNA  Anesthesia Plan Comments:        Anesthesia Quick Evaluation

## 2023-02-04 ENCOUNTER — Ambulatory Visit (HOSPITAL_COMMUNITY): Payer: Medicare Other

## 2023-02-04 ENCOUNTER — Encounter (HOSPITAL_COMMUNITY)
Admission: RE | Disposition: A | Discharge status: Home / Self Care | Payer: Self-pay | Source: Home / Self Care | Attending: Orthopedic Surgery

## 2023-02-04 ENCOUNTER — Other Ambulatory Visit: Payer: Self-pay

## 2023-02-04 ENCOUNTER — Ambulatory Visit (HOSPITAL_COMMUNITY)
Admission: RE | Admit: 2023-02-04 | Discharge: 2023-02-04 | Disposition: A | Discharge status: Home / Self Care | Payer: Medicare Other | Attending: Orthopedic Surgery | Admitting: Orthopedic Surgery

## 2023-02-04 ENCOUNTER — Ambulatory Visit (HOSPITAL_COMMUNITY): Payer: Self-pay

## 2023-02-04 ENCOUNTER — Encounter (HOSPITAL_COMMUNITY): Payer: Self-pay | Admitting: Orthopedic Surgery

## 2023-02-04 ENCOUNTER — Ambulatory Visit (HOSPITAL_BASED_OUTPATIENT_CLINIC_OR_DEPARTMENT_OTHER): Payer: Medicare Other

## 2023-02-04 DIAGNOSIS — K219 Gastro-esophageal reflux disease without esophagitis: Secondary | ICD-10-CM | POA: Diagnosis not present

## 2023-02-04 DIAGNOSIS — M1711 Unilateral primary osteoarthritis, right knee: Secondary | ICD-10-CM

## 2023-02-04 DIAGNOSIS — J449 Chronic obstructive pulmonary disease, unspecified: Secondary | ICD-10-CM | POA: Diagnosis not present

## 2023-02-04 HISTORY — PX: PARTIAL KNEE ARTHROPLASTY: SHX2174

## 2023-02-04 SURGERY — ARTHROPLASTY, KNEE, UNICOMPARTMENTAL
Anesthesia: General | Site: Knee | Laterality: Right

## 2023-02-04 MED ORDER — LACTATED RINGERS IV BOLUS: 500.0000 mL | Freq: Once | INTRAVENOUS | Status: DC

## 2023-02-04 MED ORDER — OXYCODONE HCL 5 MG PO TABS
ORAL_TABLET | ORAL | Status: AC
  Filled 2023-02-04: qty 1

## 2023-02-04 MED ORDER — DEXAMETHASONE SODIUM PHOSPHATE 10 MG/ML IJ SOLN
INTRAMUSCULAR | Status: AC
Start: 2023-02-04 — End: ?
  Filled 2023-02-04: qty 1

## 2023-02-04 MED ORDER — ONDANSETRON HCL 4 MG/2ML IJ SOLN
INTRAMUSCULAR | Status: DC | PRN
  Administered 2023-02-04: 4 mg via INTRAVENOUS

## 2023-02-04 MED ORDER — OXYCODONE HCL 5 MG PO TABS
5.0000 mg | ORAL_TABLET | ORAL | Status: DC | PRN
  Administered 2023-02-04: 5 mg via ORAL

## 2023-02-04 MED ORDER — LACTATED RINGERS IV SOLN: INTRAVENOUS | Status: DC

## 2023-02-04 MED ORDER — OXYCODONE HCL 5 MG PO TABS
ORAL_TABLET | ORAL | Status: AC
  Administered 2023-02-04: 5 mg via ORAL
  Filled 2023-02-04: qty 1

## 2023-02-04 MED ORDER — LACTATED RINGERS IV BOLUS
250.0000 mL | Freq: Once | INTRAVENOUS | Status: DC
Start: 2023-02-04 — End: 2023-02-04

## 2023-02-04 MED ORDER — CEFAZOLIN SODIUM-DEXTROSE 2-4 GM/100ML-% IV SOLN
2.0000 g | INTRAVENOUS | Status: AC
  Administered 2023-02-04: 2 g via INTRAVENOUS
  Filled 2023-02-04: qty 100

## 2023-02-04 MED ORDER — HYDROMORPHONE HCL 1 MG/ML IJ SOLN
INTRAMUSCULAR | Status: AC
  Filled 2023-02-04: qty 1

## 2023-02-04 MED ORDER — ASPIRIN 81 MG PO TBEC: 81.0000 mg | DELAYED_RELEASE_TABLET | Freq: Two times a day (BID) | ORAL | Status: AC

## 2023-02-04 MED ORDER — FENTANYL CITRATE (PF) 100 MCG/2ML IJ SOLN
INTRAMUSCULAR | Status: DC | PRN
  Administered 2023-02-04 (×4): 25 ug via INTRAVENOUS
  Administered 2023-02-04 (×2): 50 ug via INTRAVENOUS

## 2023-02-04 MED ORDER — MIDAZOLAM HCL 5 MG/5ML IJ SOLN
INTRAMUSCULAR | Status: DC | PRN
  Administered 2023-02-04: 2 mg via INTRAVENOUS

## 2023-02-04 MED ORDER — TRANEXAMIC ACID-NACL 1000-0.7 MG/100ML-% IV SOLN
1000.0000 mg | INTRAVENOUS | Status: AC
Start: 2023-02-04 — End: 2023-02-04
  Administered 2023-02-04: 1000 mg via INTRAVENOUS
  Filled 2023-02-04: qty 100

## 2023-02-04 MED ORDER — ONDANSETRON HCL 4 MG/2ML IJ SOLN
INTRAMUSCULAR | Status: AC
  Filled 2023-02-04: qty 2

## 2023-02-04 MED ORDER — BUPIVACAINE-EPINEPHRINE 0.25% -1:200000 IJ SOLN
INTRAMUSCULAR | Status: AC
  Filled 2023-02-04: qty 1

## 2023-02-04 MED ORDER — DEXAMETHASONE SODIUM PHOSPHATE 10 MG/ML IJ SOLN
8.0000 mg | Freq: Once | INTRAMUSCULAR | Status: AC
  Administered 2023-02-04: 8 mg via INTRAVENOUS

## 2023-02-04 MED ORDER — ORAL CARE MOUTH RINSE: 15.0000 mL | Freq: Once | OROMUCOSAL | Status: AC

## 2023-02-04 MED ORDER — PROPOFOL 500 MG/50ML IV EMUL
INTRAVENOUS | Status: AC
  Filled 2023-02-04: qty 50

## 2023-02-04 MED ORDER — PROPOFOL 500 MG/50ML IV EMUL
INTRAVENOUS | Status: DC | PRN
  Administered 2023-02-04: 100 ug/kg/min via INTRAVENOUS
  Administered 2023-02-04: 150 mg via INTRAVENOUS

## 2023-02-04 MED ORDER — METHOCARBAMOL 500 MG PO TABS: 500.0000 mg | ORAL_TABLET | Freq: Three times a day (TID) | ORAL | 0 refills | Status: AC | PRN

## 2023-02-04 MED ORDER — MUPIROCIN 2 % EX OINT: 1.0000 | TOPICAL_OINTMENT | Freq: Two times a day (BID) | CUTANEOUS | 0 refills | Status: AC

## 2023-02-04 MED ORDER — FENTANYL CITRATE (PF) 100 MCG/2ML IJ SOLN
INTRAMUSCULAR | Status: AC
  Filled 2023-02-04: qty 2

## 2023-02-04 MED ORDER — KETAMINE HCL 50 MG/5ML IJ SOSY
PREFILLED_SYRINGE | INTRAMUSCULAR | Status: DC | PRN
  Administered 2023-02-04: 10 mg via INTRAVENOUS
  Administered 2023-02-04: 20 mg via INTRAVENOUS

## 2023-02-04 MED ORDER — POVIDONE-IODINE 10 % EX SWAB
2.0000 | Freq: Once | CUTANEOUS | Status: AC
  Administered 2023-02-04: 2 via TOPICAL

## 2023-02-04 MED ORDER — SODIUM CHLORIDE 0.9 % IR SOLN
Status: DC | PRN
  Administered 2023-02-04: 2000 mL

## 2023-02-04 MED ORDER — CHLORHEXIDINE GLUCONATE 4 % EX SOLN: 1.0000 | CUTANEOUS | 1 refills | Status: AC

## 2023-02-04 MED ORDER — ACETAMINOPHEN 500 MG PO TABS
ORAL_TABLET | ORAL | Status: AC
  Filled 2023-02-04: qty 2

## 2023-02-04 MED ORDER — ACETAMINOPHEN 500 MG PO TABS: 1000.0000 mg | ORAL_TABLET | Freq: Once | ORAL | Status: DC

## 2023-02-04 MED ORDER — ONDANSETRON HCL 4 MG/2ML IJ SOLN: 4.0000 mg | Freq: Four times a day (QID) | INTRAMUSCULAR | Status: DC | PRN

## 2023-02-04 MED ORDER — ACETAMINOPHEN 500 MG PO TABS
1000.0000 mg | ORAL_TABLET | Freq: Once | ORAL | Status: AC
  Administered 2023-02-04: 1000 mg via ORAL
  Filled 2023-02-04: qty 2

## 2023-02-04 MED ORDER — BUPIVACAINE LIPOSOME 1.3 % IJ SUSP
INTRAMUSCULAR | Status: DC | PRN
  Administered 2023-02-04: 20 mL

## 2023-02-04 MED ORDER — CEFAZOLIN SODIUM-DEXTROSE 2-4 GM/100ML-% IV SOLN
2.0000 g | Freq: Four times a day (QID) | INTRAVENOUS | Status: DC
  Administered 2023-02-04: 2 g via INTRAVENOUS

## 2023-02-04 MED ORDER — KETOROLAC TROMETHAMINE 15 MG/ML IJ SOLN
INTRAMUSCULAR | Status: AC
  Administered 2023-02-04: 7.5 mg via INTRAVENOUS
  Filled 2023-02-04: qty 1

## 2023-02-04 MED ORDER — BUPIVACAINE LIPOSOME 1.3 % IJ SUSP: 20.0000 mL | Freq: Once | INTRAMUSCULAR | Status: DC

## 2023-02-04 MED ORDER — BUPIVACAINE IN DEXTROSE 0.75-8.25 % IT SOLN
INTRATHECAL | Status: DC | PRN
  Administered 2023-02-04: 2 mL via INTRATHECAL

## 2023-02-04 MED ORDER — HYDROMORPHONE HCL 1 MG/ML IJ SOLN
0.5000 mg | INTRAMUSCULAR | Status: DC | PRN
Start: 2023-02-04 — End: 2023-02-04

## 2023-02-04 MED ORDER — SODIUM CHLORIDE 0.9 % IV SOLN: INTRAVENOUS | Status: DC

## 2023-02-04 MED ORDER — ACETAMINOPHEN 500 MG PO TABS
1000.0000 mg | ORAL_TABLET | Freq: Four times a day (QID) | ORAL | Status: DC
  Administered 2023-02-04: 1000 mg via ORAL

## 2023-02-04 MED ORDER — KETAMINE HCL 50 MG/5ML IJ SOSY
PREFILLED_SYRINGE | INTRAMUSCULAR | Status: AC
  Filled 2023-02-04: qty 5

## 2023-02-04 MED ORDER — CEFAZOLIN SODIUM-DEXTROSE 2-4 GM/100ML-% IV SOLN
INTRAVENOUS | Status: AC
  Filled 2023-02-04: qty 100

## 2023-02-04 MED ORDER — 0.9 % SODIUM CHLORIDE (POUR BTL) OPTIME
TOPICAL | Status: DC | PRN
  Administered 2023-02-04: 1000 mL

## 2023-02-04 MED ORDER — ACETAMINOPHEN 500 MG PO TABS: 1000.0000 mg | ORAL_TABLET | Freq: Three times a day (TID) | ORAL | Status: AC | PRN

## 2023-02-04 MED ORDER — LACTATED RINGERS IV BOLUS
250.0000 mL | Freq: Once | INTRAVENOUS | Status: AC
  Administered 2023-02-04: 250 mL via INTRAVENOUS

## 2023-02-04 MED ORDER — ONDANSETRON HCL 4 MG PO TABS: 4.0000 mg | ORAL_TABLET | Freq: Three times a day (TID) | ORAL | 0 refills | Status: AC | PRN

## 2023-02-04 MED ORDER — POLYETHYLENE GLYCOL 3350 17 G PO PACK: 17.0000 g | PACK | Freq: Every day | ORAL | 0 refills | Status: AC

## 2023-02-04 MED ORDER — ONDANSETRON HCL 4 MG PO TABS: 4.0000 mg | ORAL_TABLET | Freq: Four times a day (QID) | ORAL | Status: DC | PRN

## 2023-02-04 MED ORDER — METHOCARBAMOL 500 MG PO TABS
ORAL_TABLET | ORAL | Status: AC
  Filled 2023-02-04: qty 1

## 2023-02-04 MED ORDER — KETOROLAC TROMETHAMINE 15 MG/ML IJ SOLN: 7.5000 mg | Freq: Four times a day (QID) | INTRAMUSCULAR | Status: DC

## 2023-02-04 MED ORDER — METHOCARBAMOL 1000 MG/10ML IJ SOLN: 500.0000 mg | Freq: Four times a day (QID) | INTRAMUSCULAR | Status: DC | PRN

## 2023-02-04 MED ORDER — BUPIVACAINE LIPOSOME 1.3 % IJ SUSP
INTRAMUSCULAR | Status: AC
  Filled 2023-02-04: qty 20

## 2023-02-04 MED ORDER — BUPIVACAINE-EPINEPHRINE (PF) 0.25% -1:200000 IJ SOLN
INTRAMUSCULAR | Status: DC | PRN
  Administered 2023-02-04: 30 mL

## 2023-02-04 MED ORDER — OXYCODONE HCL 5 MG PO TABS: 5.0000 mg | ORAL_TABLET | ORAL | 0 refills | Status: AC | PRN

## 2023-02-04 MED ORDER — HYDROMORPHONE HCL 1 MG/ML IJ SOLN
0.2500 mg | INTRAMUSCULAR | Status: DC | PRN
  Administered 2023-02-04: 0.25 mg via INTRAVENOUS
  Administered 2023-02-04: 0.5 mg via INTRAVENOUS

## 2023-02-04 MED ORDER — ACETAMINOPHEN 325 MG PO TABS: 325.0000 mg | ORAL_TABLET | Freq: Four times a day (QID) | ORAL | Status: DC | PRN

## 2023-02-04 MED ORDER — MIDAZOLAM HCL 2 MG/2ML IJ SOLN
INTRAMUSCULAR | Status: AC
Start: 2023-02-04 — End: ?
  Filled 2023-02-04: qty 2

## 2023-02-04 MED ORDER — CELECOXIB 100 MG PO CAPS: 100.0000 mg | ORAL_CAPSULE | Freq: Two times a day (BID) | ORAL | 0 refills | Status: AC

## 2023-02-04 MED ORDER — CHLORHEXIDINE GLUCONATE 0.12 % MT SOLN
15.0000 mL | Freq: Once | OROMUCOSAL | Status: AC
  Administered 2023-02-04: 15 mL via OROMUCOSAL

## 2023-02-04 MED ORDER — METHOCARBAMOL 500 MG PO TABS
500.0000 mg | ORAL_TABLET | Freq: Four times a day (QID) | ORAL | Status: DC | PRN
  Administered 2023-02-04: 500 mg via ORAL

## 2023-02-04 MED ORDER — STERILE WATER FOR IRRIGATION IR SOLN
Status: DC | PRN
Start: 2023-02-04 — End: 2023-02-04
  Administered 2023-02-04: 1000 mL

## 2023-02-04 MED ORDER — PROPOFOL 1000 MG/100ML IV EMUL
INTRAVENOUS | Status: AC
  Filled 2023-02-04: qty 100

## 2023-02-04 SURGICAL SUPPLY — 52 items
BAG COUNTER SPONGE SURGICOUNT (BAG) ×1 IMPLANT
BASEPLATE TIB UNI JOUR 4 RT (Plate) IMPLANT
BLADE SAW RECIPROCATING 77.5 (BLADE) ×1 IMPLANT
BLADE SAW SGTL 13.0X1.19X90.0M (BLADE) ×1 IMPLANT
BNDG COHESIVE 3X5 TAN ST LF (GAUZE/BANDAGES/DRESSINGS) ×1 IMPLANT
BNDG ELASTIC 6X10 VLCR STRL LF (GAUZE/BANDAGES/DRESSINGS) ×1 IMPLANT
BOWL SMART MIX CTS (DISPOSABLE) ×1 IMPLANT
CEMENT BONE R 1X40 (Cement) IMPLANT
CEMENT BONE REFOBACIN R1X40 US (Cement) IMPLANT
CHLORAPREP W/TINT 26 (MISCELLANEOUS) ×1 IMPLANT
COMP FEM UNI JOUR 6 LM/RL (Joint) ×1 IMPLANT
COMPONENT FEM UNI JOUR 6 LM/RL (Joint) IMPLANT
COVER SURGICAL LIGHT HANDLE (MISCELLANEOUS) ×1 IMPLANT
CUFF TRNQT CYL 34X4.125X (TOURNIQUET CUFF) ×1 IMPLANT
DERMABOND ADVANCED .7 DNX12 (GAUZE/BANDAGES/DRESSINGS) ×1 IMPLANT
DRAPE INCISE IOBAN 85X60 (DRAPES) ×1 IMPLANT
DRAPE SHEET LG 3/4 BI-LAMINATE (DRAPES) ×1 IMPLANT
DRAPE U-SHAPE 47X51 STRL (DRAPES) ×1 IMPLANT
DRESSING AQUACEL AG SP 3.5X10 (GAUZE/BANDAGES/DRESSINGS) IMPLANT
DRSG AQUACEL AG ADV 3.5X 6 (GAUZE/BANDAGES/DRESSINGS) IMPLANT
DRSG AQUACEL AG SP 3.5X10 (GAUZE/BANDAGES/DRESSINGS) ×1
ELECT REM PT RETURN 15FT ADLT (MISCELLANEOUS) ×1 IMPLANT
GAUZE SPONGE 4X4 12PLY STRL (GAUZE/BANDAGES/DRESSINGS) ×1 IMPLANT
GLOVE BIO SURGEON STRL SZ 6.5 (GLOVE) ×1 IMPLANT
GLOVE BIOGEL PI IND STRL 6.5 (GLOVE) ×2 IMPLANT
GLOVE BIOGEL PI IND STRL 8 (GLOVE) ×1 IMPLANT
GLOVE SURG ORTHO 8.0 STRL STRW (GLOVE) ×2 IMPLANT
GOWN STRL REUS W/ TWL XL LVL3 (GOWN DISPOSABLE) ×2 IMPLANT
HOOD PEEL AWAY T7 (MISCELLANEOUS) ×3 IMPLANT
INSERT TIB UNI JOUR 4-5 8 (Insert) IMPLANT
KIT TURNOVER KIT A (KITS) IMPLANT
MANIFOLD NEPTUNE II (INSTRUMENTS) ×1 IMPLANT
MARKER SKIN DUAL TIP RULER LAB (MISCELLANEOUS) ×1 IMPLANT
NS IRRIG 1000ML POUR BTL (IV SOLUTION) ×1 IMPLANT
PACK TOTAL KNEE CUSTOM (KITS) ×1 IMPLANT
PIN FIX RIM MIS 3.2X30 STL (PIN) IMPLANT
PIN SPEED NONRIM 65 STERL KNEE (PIN) IMPLANT
SET HNDPC FAN SPRY TIP SCT (DISPOSABLE) ×1 IMPLANT
SOLUTION IRRIG SURGIPHOR (IV SOLUTION) IMPLANT
STRIP CLOSURE SKIN 1/2X4 (GAUZE/BANDAGES/DRESSINGS) ×1 IMPLANT
SUT MNCRL AB 3-0 PS2 18 (SUTURE) ×1 IMPLANT
SUT MNCRL AB 3-0 PS2 27 (SUTURE) ×1 IMPLANT
SUT STRATAFIX 0 PDS 27 VIOLET (SUTURE) ×1
SUT STRATAFIX 14 PDO 48 VLT (SUTURE) ×1 IMPLANT
SUT STRATAFIX PDO 1 14 VIOLET (SUTURE) ×1
SUT VIC AB 0 CT1 18XCR BRD 8 (SUTURE) IMPLANT
SUT VIC AB 2-0 CT2 27 (SUTURE) ×1 IMPLANT
SUTURE STRATFX 0 PDS 27 VIOLET (SUTURE) ×1 IMPLANT
TRAY FOLEY MTR SLVR 16FR STAT (SET/KITS/TRAYS/PACK) IMPLANT
TUBE SUCTION HIGH CAP CLEAR NV (SUCTIONS) ×1 IMPLANT
UNDERPAD 30X36 HEAVY ABSORB (UNDERPADS AND DIAPERS) ×1 IMPLANT
WRAP KNEE MAXI GEL POST OP (GAUZE/BANDAGES/DRESSINGS) ×1 IMPLANT

## 2023-02-04 NOTE — Anesthesia Procedure Notes (Signed)
 Spinal  Patient location during procedure: OR Start time: 02/04/2023 8:30 AM End time: 02/04/2023 8:32 AM Reason for block: surgical anesthesia Staffing Performed: anesthesiologist  Anesthesiologist: Jake Mayers, MD Performed by: Jake Mayers, MD Authorized by: Jake Mayers, MD   Preanesthetic Checklist Completed: patient identified, IV checked, risks and benefits discussed, surgical consent, monitors and equipment checked, pre-op evaluation and timeout performed Spinal Block Patient position: sitting Prep: DuraPrep Patient monitoring: cardiac monitor, continuous pulse ox and blood pressure Approach: midline Location: L3-4 Injection technique: single-shot Needle Needle type: Pencan  Needle gauge: 24 G Needle length: 9 cm Assessment Sensory level: T8 Events: CSF return Additional Notes Functioning IV was confirmed and monitors were applied. Sterile prep and drape, including hand hygiene and sterile gloves were used. The patient was positioned and the spine was prepped. The skin was anesthetized with lidocaine.  Free flow of clear CSF was obtained prior to injecting local anesthetic into the CSF.  The spinal needle aspirated freely following injection.  The needle was carefully withdrawn.  The patient tolerated the procedure well.

## 2023-02-04 NOTE — Progress Notes (Signed)
 Orthopedic Tech Progress Note Patient Details:  RICK WILTSE 12-19-1955 [id number]  Ortho Devices Type of Ortho Device: Bone foam zero knee Ortho Device/Splint Location: right Ortho Device/Splint Interventions: Ordered, Application, Adjustment   Post Interventions Patient Tolerated: Well Instructions Provided: Adjustment of device, Care of device  Leodis Rainwater 02/04/2023, 11:12 AM

## 2023-02-04 NOTE — Op Note (Signed)
 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  ANESTHESIA:   General  ESTIMATED BLOOD LOSS: 50 cc  UNIQUE ASPECTS OF THE CASE: Spinal anesthetic was performed however patient still seemed to be reactive once ready to start the procedure, general anesthesia was then induced.  PREOPERATIVE INDICATIONS:  Mark [name] is a  68 y.o. male with a diagnosis of end-stage lateral compartment right knee osteoarthritis who failed conservative measures and elected for surgical management.    The risks benefits and alternatives were discussed with the patient preoperatively including but not limited to the risks of infection, bleeding, nerve injury, cardiopulmonary complications, blood clots, the need for revision surgery, among others, and the patient was willing to proceed.  OPERATIVE IMPLANTS: [employer] journey 2 lateral right size 6 Oxinium femoral component, journey 2 size 4 right lateral tibial baseplate, 8 mm highly cross-linked poly fixed-bearing insert   OPERATIVE FINDINGS: Endstage grade 4 lateral compartment osteoarthritis. No significant changes in the medial or patellofemoral joint.  The ACL was intact.  OPERATIVE PROCEDURE:   Once adequate anesthesia, preoperative antibiotics, 2 gm of ancef ,1 gm of Tranexamic Acid , and 8 mg of Decadron  administered, the patient was positioned supine with a right thigh tourniquet placed.  The right lower extremity was prepped and draped in sterile fashion.  A time-  out was performed identifying the patient, planned procedure, and the appropriate extremity.    The leg was elevated and exsanguinated and the tourniquet was inflated. Anterior midline incision was performed. Lateral Parapatellar incision was carried out, and the osteophytes were excised, along with the lateral plateau and femoral condyle. Resected anterior horn of lateral meniscus and a small portion of the fat pad. lateral release was performed.  Remainder of the knee was evaluated.  ACL was intact medial compartment with no significant wear.  Patellofemoral compartment with mild wear.  The extra medullary tibial cutting jig was applied, and resection was performed measuring 4 mm from the lateral defect.  Cut was made perpendicular to the axis of the tibia, matching the patient's native slope, and sagittal cut was made with appropriate rotation and just lateral to the ACL insertion.    The proximal tibial bony cut was removed in one piece, and I turned my attention to the femur.  Rasp was used to clear debris from the corner of the cut.  We next turned our attention to the femur.  Distal femoral cutting block was put in place and pinned and the distal femur was cut.  The cutting guide was removed and the cut was completed with the knee in flexion.  We then sized the femur to be a 6 and then pinned the femur cutting guide into place taking care to appropriately lateralized and not overhang the component, medially or anteriorly.  The femur was drilled and the posterior and chamfer cuts were made.  With the knee in flexion lateral meniscectomy was performed.  We next size of the tibia and found it to be a size 4.  The tibial trial was then impacted into place in the right position pinned and the lugs were drilled.  The trial femoral component was then impacted, and  a size 8mm liner trial was inserted.  Initially the knee was tight in extension but with 2 to 3 mm of play in flexion.  Elected to reresect the proximal tibia with a little bit less slope.  Trial components were  reinserted. With the trial liner in place we used the Amber sticks and had approximately 2 mm of laxity in extension and 2 to 3 mm of laxity in flexion.  The femur also tracked appropriately on the center of the tibial component through flexion.   I then cemented the components into place, cementing the tibia first, removing all excess cement, and then cementing the femur.  All loose cement was removed.  The trial liner was again inserted until cement had completely set.  The wounds were thoroughly irrigated with normal saline pulse lavage and injected with 20cc Exparel  diluted with 30cc 0.25% marcaine  with epi.  Once the cement was set again we assessed stability and balance which we felt was appropriate with a size 8 mm liner.  The real polyethylene liner was opened and inserted.   The tourniquet was let down  No significant   hemostasis was required.  The medial parapatellar arthrotomy was then reapproximated using #1 Vicryl and #1 Stratafix sutures with the knee  in flexion.  The   remaining wound was closed with 0 stratafix, 2-0 Vicryl, and running 3-0 Monocryl.   The knee was cleaned, dried, dressed sterilely using Dermabond and   Aquacel dressing.  The patient was then  brought to recovery room in stable condition, tolerating the procedure  well. There were no complications.   Post op recs: WB: WBAT Abx: ancef  periop Imaging: PACU xrays DVT prophylaxis: Aspirin  81mg  BID x4 weeks Follow up: 2 weeks after surgery for a wound check with Dr. Pryor Browning at [facility].  Address: [address], [place], [place] [address]  Office Phone: [phone]  Priscille Brought, MD Orthopaedic Surgery

## 2023-02-04 NOTE — Anesthesia Procedure Notes (Signed)
 Procedure Name: LMA Insertion Date/Time: 02/04/2023 9:05 AM  Performed by: Jinny Mounts, CRNAPre-anesthesia Checklist: Patient identified, Emergency Drugs available, Suction available and Patient being monitored Patient Re-evaluated:Patient Re-evaluated prior to induction Oxygen Delivery Method: Circle System Utilized Preoxygenation: Pre-oxygenation with 100% oxygen Induction Type: IV induction LMA: LMA inserted LMA Size: 4.0 Number of attempts: 1 Placement Confirmation: positive ETCO2 and breath sounds checked- equal and bilateral Tube secured with: Tape Dental Injury: Teeth and Oropharynx as per pre-operative assessment

## 2023-02-04 NOTE — Transfer of Care (Signed)
 Immediate Anesthesia Transfer of Care Note  Patient: Mark [name]  Procedure(s) Performed: UNICOMPARTMENTAL KNEE-LATERAL (Right: Knee)  Patient Location: PACU  Anesthesia Type:General  Level of Consciousness: sedated  Airway & Oxygen Therapy: Patient Spontanous Breathing and Patient connected to face mask oxygen  Post-op Assessment: Report given to RN and Post -op Vital signs reviewed and stable  Post vital signs: Reviewed and stable  Last Vitals:  Vitals Value Taken Time  BP    Temp    Pulse 67 02/04/23 1101  Resp 10 02/04/23 1101  SpO2 100 % 02/04/23 1101  Vitals shown include unfiled device data.  Last Pain:  Vitals:   02/04/23 0658  TempSrc: Oral  PainSc: 0-No pain         Complications: No notable events documented.

## 2023-02-04 NOTE — Evaluation (Signed)
 Physical Therapy Evaluation Patient Details Name: Mark [name] MRN: [id number] DOB: 20-Jul-1955 Today's Date: 02/04/2023  History of Present Illness  68 y.o. male presented 02/04/23 for R lateral UKA. PMH: HA, OA, plantar fasciitis, GERD  Clinical Impression  Pt is mobilizing well and is ready to DC from a PT standpoint. He ambulated 120' with RW, no loss of balance nor buckling. Stair training completed. Pt demonstrates good understanding of HEP. Good progress expected, pt puts forth good effort. Pt reports they have OPPT scheduled for 02/09/23.         If plan is discharge home, recommend the following: A little help with bathing/dressing/bathroom;Assistance with cooking/housework;Assist for transportation;Help with stairs or ramp for entrance   Can travel by private vehicle        Equipment Recommendations None recommended by PT  Recommendations for Other Services       Functional Status Assessment Patient has had a recent decline in their functional status and demonstrates the ability to make significant improvements in function in a reasonable and predictable amount of time.     Precautions / Restrictions Precautions Precautions: Knee;Fall Precaution Booklet Issued: Yes (comment) Precaution Comments: reviewed no pillow under knee; denies falls in past 6 months Restrictions Weight Bearing Restrictions Per Provider Order: No      Mobility  Bed Mobility Overal bed mobility: Modified Independent             General bed mobility comments: HOB up    Transfers Overall transfer level: Needs assistance Equipment used: Rolling walker (2 wheels) Transfers: Sit to/from Stand Sit to Stand: Supervision           General transfer comment: VCs hand placement    Ambulation/Gait Ambulation/Gait assistance: Supervision Gait Distance (Feet): 120 Feet Assistive device: Rolling walker (2 wheels) Gait Pattern/deviations: Decreased step length - left, Decreased step length  - right, Step-to pattern Gait velocity: decr     General Gait Details: steady with RW, no loss of balance, VCs to lift head  Stairs Stairs: Yes Stairs assistance: Contact guard assist Stair Management: One rail Left, Step to pattern, Forwards Number of Stairs: 3 General stair comments: VCs sequencing, no loss of balance  Wheelchair Mobility     Tilt Bed    Modified Rankin (Stroke Patients Only)       Balance Overall balance assessment: Modified Independent                                           Pertinent Vitals/Pain Pain Assessment Pain Assessment: 0-10 Pain Score: 3  Pain Location: R knee Pain Descriptors / Indicators: Sore Pain Intervention(s): Limited activity within patient's tolerance, Monitored during session, Premedicated before session, Ice applied    Home Living Family/patient expects to be discharged to:: Private residence Living Arrangements: Spouse/significant other;Children Available Help at Discharge: Family;Available 24 hours/day Type of Home: House Home Access: Stairs to enter Entrance Stairs-Rails: Right Entrance Stairs-Number of Steps: 2 Alternate Level Stairs-Number of Steps: flight Home Layout: Two level;Able to live on main level with bedroom/bathroom Home Equipment: Rolling Walker (2 wheels);Tub bench;Cane - single point      Prior Function Prior Level of Function : Independent/Modified Independent             Mobility Comments: walked without AD, denies falls in past 6 months ADLs Comments: independent     Extremity/Trunk Assessment   Upper Extremity Assessment  Upper Extremity Assessment: Overall WFL for tasks assessed    Lower Extremity Assessment Lower Extremity Assessment: RLE deficits/detail RLE Deficits / Details: SLR 3/5, knee ext at least 3/5; AAROM ~5-75* knee RLE Sensation: WNL RLE Coordination: WNL    Cervical / Trunk Assessment Cervical / Trunk Assessment: Normal  Communication    Communication Communication: No apparent difficulties  Cognition Arousal: Alert Behavior During Therapy: WFL for tasks assessed/performed Overall Cognitive Status: Within Functional Limits for tasks assessed                                          General Comments      Exercises     Assessment/Plan    PT Assessment All further PT needs can be met in the next venue of care  PT Problem List Decreased range of motion;Decreased activity tolerance;Decreased strength       PT Treatment Interventions      PT Goals (Current goals can be found in the Care Plan section)  Acute Rehab PT Goals 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 PT Goal Formulation: All assessment and education complete, DC therapy    Frequency       Co-evaluation               AM-PAC PT "6 Clicks" Mobility  Outcome Measure Help needed turning from your back to your side while in a flat bed without using bedrails?: None Help needed moving from lying on your back to sitting on the side of a flat bed without using bedrails?: None Help needed moving to and from a bed to a chair (including a wheelchair)?: None Help needed standing up from a chair using your arms (e.g., wheelchair or bedside chair)?: None Help needed to walk in hospital room?: None Help needed climbing 3-5 steps with a railing? : A Little 6 Click Score: 23    End of Session Equipment Utilized During Treatment: Gait belt Activity Tolerance: Patient tolerated treatment well Patient left: in bed;with call bell/phone within reach;with family/visitor present;with nursing/sitter in room Nurse Communication: Mobility status PT Visit Diagnosis: Pain Pain - Right/Left: Right Pain - part of body: Knee    Time: 9604-5409 PT Time Calculation (min) (ACUTE ONLY): 41 min   Charges:   PT Evaluation $PT Eval Moderate Complexity: 1 Mod PT Treatments $Gait Training: 8-22 mins $Therapeutic Exercise: 8-22 mins            Lynann Sandman Kistler PT 02/04/2023  Acute Rehabilitation Services  Office [phone]

## 2023-02-04 NOTE — Anesthesia Postprocedure Evaluation (Signed)
 Anesthesia Post Note  Patient: Mark [name]  Procedure(s) Performed: UNICOMPARTMENTAL KNEE-LATERAL (Right: Knee)     Patient location during evaluation: PACU Anesthesia Type: General Level of consciousness: awake and alert Pain management: pain level controlled Vital Signs Assessment: post-procedure vital signs reviewed and stable Respiratory status: spontaneous breathing, nonlabored ventilation and respiratory function stable Cardiovascular status: blood pressure returned to baseline and stable Postop Assessment: no apparent nausea or vomiting Anesthetic complications: no  No notable events documented.  Last Vitals:  Vitals:   02/04/23 1115 02/04/23 1130  BP: (!) 162/99 (!) 176/95  Pulse: 64 60  Resp: 14 11  Temp:    SpO2: 100% 99%    Last Pain:  Vitals:   02/04/23 1127  TempSrc:   PainSc: 4     LLE Motor Response: Purposeful movement (02/04/23 1130)   RLE Motor Response: Purposeful movement (02/04/23 1130)   L Sensory Level: S1-Sole of foot, small toes (02/04/23 1130) R Sensory Level: S1-Sole of foot, small toes (02/04/23 1130)  Samya Siciliano,W. EDMOND

## 2023-02-04 NOTE — Interval H&P Note (Signed)
 The patient has been re-examined, and the chart reviewed, and there have been no interval changes to the documented history and physical.    Plan for R lateral UKA for lateral compartment OA  The operative side was examined and the patient was confirmed to have sensation to DPN, SPN, TN intact, Motor EHL, ext, flex 5/5, and DP 2+, PT 2+, No significant edema.   The risks, benefits, and alternatives have been discussed at length with patient, and the patient is willing to proceed.  R knee marked. Consent has been signed.

## 2023-02-04 NOTE — Discharge Instructions (Signed)

## 2023-02-05 ENCOUNTER — Encounter (HOSPITAL_COMMUNITY): Payer: Self-pay | Admitting: Orthopedic Surgery

## 2023-02-09 ENCOUNTER — Ambulatory Visit: Payer: Medicare Other | Attending: Orthopedic Surgery

## 2023-02-09 ENCOUNTER — Other Ambulatory Visit: Payer: Self-pay

## 2023-02-09 DIAGNOSIS — M25561 Pain in right knee: Secondary | ICD-10-CM | POA: Diagnosis present

## 2023-02-09 DIAGNOSIS — M25661 Stiffness of right knee, not elsewhere classified: Secondary | ICD-10-CM | POA: Insufficient documentation

## 2023-02-09 DIAGNOSIS — R600 Localized edema: Secondary | ICD-10-CM | POA: Diagnosis present

## 2023-02-09 NOTE — Therapy (Signed)
OUTPATIENT PHYSICAL THERAPY LOWER EXTREMITY EVALUATION   Patient Name: Mark [name] MRN: [id number] DOB:08-14-1955, 68 y.o., male Today's Date: 02/09/2023  END OF SESSION:  PT End of Session - 02/09/23 1421     Visit Number 1    Number of Visits 8    Date for PT Re-Evaluation 03/20/23    PT Start Time 1432    PT Stop Time 1503    PT Time Calculation (min) 31 min    Activity Tolerance Patient tolerated treatment well    Behavior During Therapy WFL for tasks assessed/performed             Past Medical History:  Diagnosis Date   Allergies    Arthritis    Chronic headaches    not in last few years   COPD (chronic obstructive pulmonary disease) (HCC)    ED (erectile dysfunction)    GERD (gastroesophageal reflux disease)    H/O sebaceous cyst    Plantar fasciitis    Past Surgical History:  Procedure Laterality Date   CATARACT EXTRACTION Bilateral    COLONOSCOPY W/ BIOPSIES AND POLYPECTOMY     PARTIAL KNEE ARTHROPLASTY Right 02/04/2023   Procedure: UNICOMPARTMENTAL KNEE-LATERAL;  Surgeon: Joen Laura, MD;  Location: [facility];  Service: Orthopedics;  Laterality: Right;   Patient Active Problem List   Diagnosis Date Noted   Non-allergic rhinitis 02/23/2018   Chronic intractable headache 10/13/2016    PCP: Mattie Marlin, DO  REFERRING PROVIDER: Joen Laura, MD   REFERRING DIAG: Post Op Right Partial Knee Replacement-Lateral   THERAPY DIAG:  Acute pain of right knee  Stiffness of right knee, not elsewhere classified  Localized edema  Rationale for Evaluation and Treatment: Rehabilitation  ONSET DATE: 02/04/23  SUBJECTIVE:   SUBJECTIVE STATEMENT: Patient reports that he had a partial right knee replacement on 02/04/23. He notes that it hasn't been too bad since surgery. The hardest part is trying to get comfortable to sleep at night. He has been trying to do the exercise that he was given at home.   PERTINENT HISTORY: Allergies, arthritis,  and COPD PAIN:  Are you having pain? Yes: NPRS scale: 2-3/10 Pain location: right anterior knee Pain description: sore Aggravating factors: prolonged sitting and transfers Relieving factors: medication and ice  PRECAUTIONS: None  RED FLAGS: None   WEIGHT BEARING RESTRICTIONS: No  FALLS:  Has patient fallen in last 6 months? No  LIVING ENVIRONMENT: Lives with: lives with their family Lives in: House/apartment Stairs: Yes: Internal: 12 steps; on left going up; step to pattern leading with the left foot going up and the right foot going down  Has following equipment at home: Single point cane and Walker - 2 wheeled  OCCUPATION: retired  PLOF: Independent  PATIENT GOALS: be able climb ladders, crawl under houses, be able to do yard work, and return to his prior level of function   NEXT MD VISIT: 02/19/23  OBJECTIVE:  Note: Objective measures were completed at Evaluation unless otherwise noted.  DIAGNOSTIC FINDINGS: 02/04/23 right knee x-ray IMPRESSION: Interval lateral unicompartmental knee arthroplasty with expected postoperative changes. PATIENT SURVEYS:  FOTO [id number]  COGNITION: Overall cognitive status: Within functional limits for tasks assessed     SENSATION: Patient reports no numbness or tingling  EDEMA:  Circumferential: R tibiofemoral joint line: 50.1 cm L tibiofemoral joint line: 44.0 cm  PALPATION: No tenderness to palpation reported  LOWER EXTREMITY ROM:  Active ROM Right eval Left eval  Hip flexion  Hip extension    Hip abduction    Hip adduction    Hip internal rotation    Hip external rotation    Knee flexion 110 137  Knee extension 10 0  Ankle dorsiflexion    Ankle plantarflexion    Ankle inversion    Ankle eversion     (Blank rows = not tested)  LOWER EXTREMITY MMT: not tested due to surgical condition  GAIT: Assistive device utilized: Environmental consultant - 2 wheeled Level of assistance: Modified independence Comments: decreased gait  speed                                                                                                                                TREATMENT DATE:     PATIENT EDUCATION:  Education details: plan of care, prognosis, objective findings, healing, expectation for soreness, and goals for physical therapy Person educated: Patient Education method: Explanation Education comprehension: verbalized understanding  HOME EXERCISE PROGRAM:   ASSESSMENT:  CLINICAL IMPRESSION: Patient is a 68 y.o. male who was seen today for physical therapy evaluation and treatment following a right partial knee replacement on 02/04/23. He presented with low pain severity and irritability. He exhibited increased right knee edema compared to the left knee. However, he exhibited no other signs or symptoms of a postoperative complication. Recommend that he continue with skilled physical therapy to address his impairments to return to his prior level of function.  OBJECTIVE IMPAIRMENTS: Abnormal gait, decreased activity tolerance, decreased mobility, difficulty walking, decreased ROM, decreased strength, increased edema, and pain.   ACTIVITY LIMITATIONS: sitting, sleeping, stairs, transfers, and locomotion level  PARTICIPATION LIMITATIONS: shopping, community activity, and yard work  PERSONAL FACTORS: 3+ comorbidities: Allergies, arthritis, and COPD  are also affecting patient's functional outcome.   REHAB POTENTIAL: Good  CLINICAL DECISION MAKING: Stable/uncomplicated  EVALUATION COMPLEXITY: Low   GOALS: Goals reviewed with patient? Yes  LONG TERM GOALS: Target date: 03/02/23  Patient will be independent with his HEP.  Baseline:  Goal status: INITIAL  2.  Patient will be able to demonstrate at least 120 degrees of right knee flexion for improved function navigating stairs. Baseline:  Goal status: INITIAL  3.  Patient will be able to navigate at least 4 steps with a reciprocal pattern for improved  household mobility. Baseline:  Goal status: INITIAL  4.  Patient will improve his right knee extension within 5 degrees of neutral for improved gait mechanics. Baseline:  Goal status: INITIAL  5.  Patient will be able to safely ambulate without an assistive device with no significant gait deviations. Baseline:  Goal status: INITIAL  PLAN:  PT FREQUENCY: 2x/week  PT DURATION: 4 weeks  PLANNED INTERVENTIONS: 97164- PT Re-evaluation, 97110-Therapeutic exercises, 97530- Therapeutic activity, 97112- Neuromuscular re-education, 97535- Self Care, 16109- Manual therapy, 97116- Gait training, 97014- Electrical stimulation (unattended), 97016- Vasopneumatic device, Patient/Family education, Balance training, Stair training, Joint mobilization, Cryotherapy, and Moist heat  PLAN FOR NEXT SESSION: Nustep,  lower extremity strengthening, gastroc stretch, and modalities as needed   Granville Lewis, PT 02/09/2023, 5:04 PM

## 2023-02-10 ENCOUNTER — Encounter: Payer: Self-pay | Admitting: Family Medicine

## 2023-02-12 ENCOUNTER — Ambulatory Visit: Payer: Medicare Other

## 2023-02-12 DIAGNOSIS — M25661 Stiffness of right knee, not elsewhere classified: Secondary | ICD-10-CM

## 2023-02-12 DIAGNOSIS — M25561 Pain in right knee: Secondary | ICD-10-CM | POA: Diagnosis not present

## 2023-02-12 DIAGNOSIS — R600 Localized edema: Secondary | ICD-10-CM

## 2023-02-12 NOTE — Therapy (Signed)
OUTPATIENT PHYSICAL THERAPY LOWER EXTREMITY TREATMENT   Patient Name: Mark [name] MRN: [id number] DOB:12-15-1955, 68 y.o., male Today's Date: 02/12/2023  END OF SESSION:  PT End of Session - 02/12/23 1634     Visit Number 2    Number of Visits 8    Date for PT Re-Evaluation 03/20/23    PT Start Time 1635    PT Stop Time 1723    PT Time Calculation (min) 48 min    Activity Tolerance Patient tolerated treatment well    Behavior During Therapy WFL for tasks assessed/performed              Past Medical History:  Diagnosis Date   Allergies    Arthritis    Chronic headaches    not in last few years   COPD (chronic obstructive pulmonary disease) (HCC)    ED (erectile dysfunction)    GERD (gastroesophageal reflux disease)    H/O sebaceous cyst    Plantar fasciitis    Past Surgical History:  Procedure Laterality Date   CATARACT EXTRACTION Bilateral    COLONOSCOPY W/ BIOPSIES AND POLYPECTOMY     PARTIAL KNEE ARTHROPLASTY Right 02/04/2023   Procedure: UNICOMPARTMENTAL KNEE-LATERAL;  Surgeon: Joen Laura, MD;  Location: [facility];  Service: Orthopedics;  Laterality: Right;   Patient Active Problem List   Diagnosis Date Noted   Non-allergic rhinitis 02/23/2018   Chronic intractable headache 10/13/2016    PCP: Mattie Marlin, DO  REFERRING PROVIDER: Joen Laura, MD   REFERRING DIAG: Post Op Right Partial Knee Replacement-Lateral   THERAPY DIAG:  Acute pain of right knee  Stiffness of right knee, not elsewhere classified  Localized edema  Rationale for Evaluation and Treatment: Rehabilitation  ONSET DATE: 02/04/23  SUBJECTIVE:   SUBJECTIVE STATEMENT: Patient reports that he feels about the same today. He has some calf tightness due to keeping his leg propped up. However, he feels better walking without his cane than it does with it.   PERTINENT HISTORY: Allergies, arthritis, and COPD PAIN:  Are you having pain? Yes: NPRS scale:  2-3/10 Pain location: right anterior knee Pain description: sore Aggravating factors: prolonged sitting and transfers Relieving factors: medication and ice  PRECAUTIONS: None  RED FLAGS: None   WEIGHT BEARING RESTRICTIONS: No  FALLS:  Has patient fallen in last 6 months? No  LIVING ENVIRONMENT: Lives with: lives with their family Lives in: House/apartment Stairs: Yes: Internal: 12 steps; on left going up; step to pattern leading with the left foot going up and the right foot going down  Has following equipment at home: Single point cane and Walker - 2 wheeled  OCCUPATION: retired  PLOF: Independent  PATIENT GOALS: be able climb ladders, crawl under houses, be able to do yard work, and return to his prior level of function   NEXT MD VISIT: 02/19/23  OBJECTIVE:  Note: Objective measures were completed at Evaluation unless otherwise noted.  DIAGNOSTIC FINDINGS: 02/04/23 right knee x-ray IMPRESSION: Interval lateral unicompartmental knee arthroplasty with expected postoperative changes. PATIENT SURVEYS:  FOTO 46.26  COGNITION: Overall cognitive status: Within functional limits for tasks assessed     SENSATION: Patient reports no numbness or tingling  EDEMA:  Circumferential: R tibiofemoral joint line: 50.1 cm L tibiofemoral joint line: 44.0 cm  PALPATION: No tenderness to palpation reported  LOWER EXTREMITY ROM:  Active ROM Right eval Left eval  Hip flexion    Hip extension    Hip abduction    Hip adduction  Hip internal rotation    Hip external rotation    Knee flexion 110 137  Knee extension 10 0  Ankle dorsiflexion    Ankle plantarflexion    Ankle inversion    Ankle eversion     (Blank rows = not tested)  LOWER EXTREMITY MMT: not tested due to surgical condition  GAIT: Assistive device utilized: Environmental consultant - 2 wheeled Level of assistance: Modified independence Comments: decreased gait speed                                                                                                                                 TREATMENT DATE:                                     02/12/23 EXERCISE LOG  Exercise Repetitions and Resistance Comments  Nustep  L4 x 12 minutes   Rocker board  5 minutes   Lunges onto step  14" step x 2 minutes   SLR  20 reps         Blank cell = exercise not performed today  Manual Therapy Soft Tissue Mobilization: right quadriceps and gastrocnemius, for improved soft tissue extensibility   Modalities: no adverse reaction to today's modalities  Date:  Vaso: Knee, 34 degrees; low pressure, 15 mins, Pain and Edema  PATIENT EDUCATION:  Education details:  prognosis, healing, and expectation for soreness Person educated: Patient Education method: Explanation Education comprehension: verbalized understanding  HOME EXERCISE PROGRAM: [id number]  ASSESSMENT:  CLINICAL IMPRESSION: Patient was introduced to multiple new interventions for improved right knee mobility. He required minimal cueing with today's new interventions for proper exercise performance. Manual therapy focused on soft tissue mobilization to his right quadriceps and gastrocnemius for improved soft tissue extensibility. He reported that his knee felt "very good" upon the conclusion of treatment. He continues to require skilled physical therapy to address his remaining impairments to return to his prior level of function.    OBJECTIVE IMPAIRMENTS: Abnormal gait, decreased activity tolerance, decreased mobility, difficulty walking, decreased ROM, decreased strength, increased edema, and pain.   ACTIVITY LIMITATIONS: sitting, sleeping, stairs, transfers, and locomotion level  PARTICIPATION LIMITATIONS: shopping, community activity, and yard work  PERSONAL FACTORS: 3+ comorbidities: Allergies, arthritis, and COPD  are also affecting patient's functional outcome.   REHAB POTENTIAL: Good  CLINICAL DECISION MAKING: Stable/uncomplicated  EVALUATION  COMPLEXITY: Low   GOALS: Goals reviewed with patient? Yes  LONG TERM GOALS: Target date: 03/02/23  Patient will be independent with his HEP.  Baseline:  Goal status: INITIAL  2.  Patient will be able to demonstrate at least 120 degrees of right knee flexion for improved function navigating stairs. Baseline:  Goal status: INITIAL  3.  Patient will be able to navigate at least 4 steps with a reciprocal pattern for improved household mobility. Baseline:  Goal status: INITIAL  4.  Patient will improve his right knee extension within 5 degrees of neutral for improved gait mechanics. Baseline:  Goal status: INITIAL  5.  Patient will be able to safely ambulate without an assistive device with no significant gait deviations. Baseline:  Goal status: INITIAL  PLAN:  PT FREQUENCY: 2x/week  PT DURATION: 4 weeks  PLANNED INTERVENTIONS: 97164- PT Re-evaluation, 97110-Therapeutic exercises, 97530- Therapeutic activity, 97112- Neuromuscular re-education, 97535- Self Care, 16109- Manual therapy, [phone]- Gait training, 97014- Electrical stimulation (unattended), 97016- Vasopneumatic device, Patient/Family education, Balance training, Stair training, Joint mobilization, Cryotherapy, and Moist heat  PLAN FOR NEXT SESSION: Nustep, lower extremity strengthening, gastroc stretch, and modalities as needed   Granville Lewis, PT 02/12/2023, 5:27 PM

## 2023-02-16 ENCOUNTER — Ambulatory Visit: Payer: Medicare Other

## 2023-02-16 DIAGNOSIS — M25561 Pain in right knee: Secondary | ICD-10-CM | POA: Diagnosis not present

## 2023-02-16 DIAGNOSIS — R600 Localized edema: Secondary | ICD-10-CM

## 2023-02-16 DIAGNOSIS — M25661 Stiffness of right knee, not elsewhere classified: Secondary | ICD-10-CM

## 2023-02-16 NOTE — Therapy (Signed)
OUTPATIENT PHYSICAL THERAPY LOWER EXTREMITY TREATMENT   Patient Name: Mark [name] MRN: [id number] DOB:03/26/1955, 68 y.o., male Today's Date: 02/16/2023  END OF SESSION:  PT End of Session - 02/16/23 1608     Visit Number 3    Number of Visits 8    Date for PT Re-Evaluation 03/20/23    PT Start Time 1600    PT Stop Time 1654    PT Time Calculation (min) 54 min    Activity Tolerance Patient tolerated treatment well    Behavior During Therapy WFL for tasks assessed/performed              Past Medical History:  Diagnosis Date   Allergies    Arthritis    Chronic headaches    not in last few years   COPD (chronic obstructive pulmonary disease) (HCC)    ED (erectile dysfunction)    GERD (gastroesophageal reflux disease)    H/O sebaceous cyst    Plantar fasciitis    Past Surgical History:  Procedure Laterality Date   CATARACT EXTRACTION Bilateral    COLONOSCOPY W/ BIOPSIES AND POLYPECTOMY     PARTIAL KNEE ARTHROPLASTY Right 02/04/2023   Procedure: UNICOMPARTMENTAL KNEE-LATERAL;  Surgeon: Joen Laura, MD;  Location: [facility];  Service: Orthopedics;  Laterality: Right;   Patient Active Problem List   Diagnosis Date Noted   Non-allergic rhinitis 02/23/2018   Chronic intractable headache 10/13/2016    PCP: Mattie Marlin, DO  REFERRING PROVIDER: Joen Laura, MD   REFERRING DIAG: Post Op Right Partial Knee Replacement-Lateral   THERAPY DIAG:  Acute pain of right knee  Stiffness of right knee, not elsewhere classified  Localized edema  Rationale for Evaluation and Treatment: Rehabilitation  ONSET DATE: 02/04/23  SUBJECTIVE:   SUBJECTIVE STATEMENT: Pt reports minimal right knee soreness today.   PERTINENT HISTORY: Allergies, arthritis, and COPD PAIN:  Are you having pain? Yes: NPRS scale: 2-3/10 Pain location: right anterior knee Pain description: sore Aggravating factors: prolonged sitting and transfers Relieving factors: medication  and ice  PRECAUTIONS: None  RED FLAGS: None   WEIGHT BEARING RESTRICTIONS: No  FALLS:  Has patient fallen in last 6 months? No  LIVING ENVIRONMENT: Lives with: lives with their family Lives in: House/apartment Stairs: Yes: Internal: 12 steps; on left going up; step to pattern leading with the left foot going up and the right foot going down  Has following equipment at home: Single point cane and Walker - 2 wheeled  OCCUPATION: retired  PLOF: Independent  PATIENT GOALS: be able climb ladders, crawl under houses, be able to do yard work, and return to his prior level of function   NEXT MD VISIT: 02/19/23  OBJECTIVE:  Note: Objective measures were completed at Evaluation unless otherwise noted.  DIAGNOSTIC FINDINGS: 02/04/23 right knee x-ray IMPRESSION: Interval lateral unicompartmental knee arthroplasty with expected postoperative changes. PATIENT SURVEYS:  FOTO 46.26  COGNITION: Overall cognitive status: Within functional limits for tasks assessed     SENSATION: Patient reports no numbness or tingling  EDEMA:  Circumferential: R tibiofemoral joint line: 50.1 cm L tibiofemoral joint line: 44.0 cm  PALPATION: No tenderness to palpation reported  LOWER EXTREMITY ROM:  Active ROM Right eval Left eval  Hip flexion    Hip extension    Hip abduction    Hip adduction    Hip internal rotation    Hip external rotation    Knee flexion 110 137  Knee extension 10 0  Ankle dorsiflexion  Ankle plantarflexion    Ankle inversion    Ankle eversion     (Blank rows = not tested)  LOWER EXTREMITY MMT: not tested due to surgical condition  GAIT: Assistive device utilized: Environmental consultant - 2 wheeled Level of assistance: Modified independence Comments: decreased gait speed                                                                                                                                TREATMENT DATE:                                     02/16/23 EXERCISE  LOG  Exercise Repetitions and Resistance Comments  Nustep  L4 x 15 minutes   Rocker board  5 minutes   Lunges onto step  14" step x 3 minutes   LAQs 2# x 30 reps RLE only   Seated Marches 2# x 30 reps RLE only    Blank cell = exercise not performed today  Manual Therapy Soft Tissue Mobilization: right quadriceps and gastrocnemius, for improved soft tissue extensibility   Modalities: no adverse reaction to today's modalities  Date:  Vaso: Knee, 34 degrees; low pressure, 15 mins, Pain and Edema  PATIENT EDUCATION:  Education details:  prognosis, healing, and expectation for soreness Person educated: Patient Education method: Explanation Education comprehension: verbalized understanding  HOME EXERCISE PROGRAM: [id number]  ASSESSMENT:  CLINICAL IMPRESSION: Pt arrives for today's treatment session reporting minimal soreness and gastroc tone.  Pt able to tolerate increased time with various standing exercises today.  Pt able to tolerate addition of 2# weight with LAQs and seated marches.  STW/M performed to gastroc to decrease pain and tone.  Pt educated in ways to decrease tone and pain at home with pt verbalizing understanding.  Normal responses to vaso noted upon removal.  Pt reported decreased soreness at completion of today's treatment session.   OBJECTIVE IMPAIRMENTS: Abnormal gait, decreased activity tolerance, decreased mobility, difficulty walking, decreased ROM, decreased strength, increased edema, and pain.   ACTIVITY LIMITATIONS: sitting, sleeping, stairs, transfers, and locomotion level  PARTICIPATION LIMITATIONS: shopping, community activity, and yard work  PERSONAL FACTORS: 3+ comorbidities: Allergies, arthritis, and COPD  are also affecting patient's functional outcome.   REHAB POTENTIAL: Good  CLINICAL DECISION MAKING: Stable/uncomplicated  EVALUATION COMPLEXITY: Low   GOALS: Goals reviewed with patient? Yes  LONG TERM GOALS: Target date: 03/02/23  Patient  will be independent with his HEP.  Baseline:  Goal status: INITIAL  2.  Patient will be able to demonstrate at least 120 degrees of right knee flexion for improved function navigating stairs. Baseline:  Goal status: INITIAL  3.  Patient will be able to navigate at least 4 steps with a reciprocal pattern for improved household mobility. Baseline:  Goal status: INITIAL  4.  Patient will improve his right knee extension within 5 degrees of  neutral for improved gait mechanics. Baseline:  Goal status: INITIAL  5.  Patient will be able to safely ambulate without an assistive device with no significant gait deviations. Baseline:  Goal status: INITIAL  PLAN:  PT FREQUENCY: 2x/week  PT DURATION: 4 weeks  PLANNED INTERVENTIONS: 97164- PT Re-evaluation, 97110-Therapeutic exercises, 97530- Therapeutic activity, 97112- Neuromuscular re-education, 97535- Self Care, 16109- Manual therapy, [phone]- Gait training, 97014- Electrical stimulation (unattended), 97016- Vasopneumatic device, Patient/Family education, Balance training, Stair training, Joint mobilization, Cryotherapy, and Moist heat  PLAN FOR NEXT SESSION: Nustep, lower extremity strengthening, gastroc stretch, and modalities as needed   Newman Pies, PTA 02/16/2023, 4:54 PM

## 2023-02-18 ENCOUNTER — Ambulatory Visit: Payer: Medicare Other

## 2023-02-18 DIAGNOSIS — M25561 Pain in right knee: Secondary | ICD-10-CM | POA: Diagnosis not present

## 2023-02-18 DIAGNOSIS — R600 Localized edema: Secondary | ICD-10-CM

## 2023-02-18 DIAGNOSIS — M25661 Stiffness of right knee, not elsewhere classified: Secondary | ICD-10-CM

## 2023-02-18 NOTE — Therapy (Signed)
OUTPATIENT PHYSICAL THERAPY LOWER EXTREMITY TREATMENT   Patient Name: Mark [name] MRN: [id number] DOB:1955/06/19, 68 y.o., male Today's Date: 02/18/2023  END OF SESSION:  PT End of Session - 02/18/23 1604     Visit Number 4    Number of Visits 8    Date for PT Re-Evaluation 03/20/23    PT Start Time 1600    PT Stop Time 1650    PT Time Calculation (min) 50 min    Activity Tolerance Patient tolerated treatment well    Behavior During Therapy WFL for tasks assessed/performed               Past Medical History:  Diagnosis Date   Allergies    Arthritis    Chronic headaches    not in last few years   COPD (chronic obstructive pulmonary disease) (HCC)    ED (erectile dysfunction)    GERD (gastroesophageal reflux disease)    H/O sebaceous cyst    Plantar fasciitis    Past Surgical History:  Procedure Laterality Date   CATARACT EXTRACTION Bilateral    COLONOSCOPY W/ BIOPSIES AND POLYPECTOMY     PARTIAL KNEE ARTHROPLASTY Right 02/04/2023   Procedure: UNICOMPARTMENTAL KNEE-LATERAL;  Surgeon: Joen Laura, MD;  Location: [facility];  Service: Orthopedics;  Laterality: Right;   Patient Active Problem List   Diagnosis Date Noted   Non-allergic rhinitis 02/23/2018   Chronic intractable headache 10/13/2016    PCP: Mattie Marlin, DO  REFERRING PROVIDER: Joen Laura, MD   REFERRING DIAG: Post Op Right Partial Knee Replacement-Lateral   THERAPY DIAG:  Acute pain of right knee  Stiffness of right knee, not elsewhere classified  Localized edema  Rationale for Evaluation and Treatment: Rehabilitation  ONSET DATE: 02/04/23  SUBJECTIVE:   SUBJECTIVE STATEMENT: Patient reports that he feels good today. He has been able to do a lot of standing and walking today. He feels that he is at least 50% back to his prior level of function.   PERTINENT HISTORY: Allergies, arthritis, and COPD PAIN:  Are you having pain? Yes: NPRS scale: 2/10 Pain location:  right anterior knee Pain description: sore Aggravating factors: prolonged sitting and transfers Relieving factors: medication and ice  PRECAUTIONS: None  RED FLAGS: None   WEIGHT BEARING RESTRICTIONS: No  FALLS:  Has patient fallen in last 6 months? No  LIVING ENVIRONMENT: Lives with: lives with their family Lives in: House/apartment Stairs: Yes: Internal: 12 steps; on left going up; step to pattern leading with the left foot going up and the right foot going down  Has following equipment at home: Single point cane and Walker - 2 wheeled  OCCUPATION: retired  PLOF: Independent  PATIENT GOALS: be able climb ladders, crawl under houses, be able to do yard work, and return to his prior level of function   NEXT MD VISIT: 02/19/23  OBJECTIVE:  Note: Objective measures were completed at Evaluation unless otherwise noted.  DIAGNOSTIC FINDINGS: 02/04/23 right knee x-ray IMPRESSION: Interval lateral unicompartmental knee arthroplasty with expected postoperative changes. PATIENT SURVEYS:  FOTO 46.26  COGNITION: Overall cognitive status: Within functional limits for tasks assessed     SENSATION: Patient reports no numbness or tingling  EDEMA:  Circumferential: R tibiofemoral joint line: 50.1 cm L tibiofemoral joint line: 44.0 cm  PALPATION: No tenderness to palpation reported  LOWER EXTREMITY ROM:  Active ROM Right eval Left eval  Hip flexion    Hip extension    Hip abduction    Hip  adduction    Hip internal rotation    Hip external rotation    Knee flexion 110 137  Knee extension 10 0  Ankle dorsiflexion    Ankle plantarflexion    Ankle inversion    Ankle eversion     (Blank rows = not tested)  LOWER EXTREMITY MMT: not tested due to surgical condition  GAIT: Assistive device utilized: Environmental consultant - 2 wheeled Level of assistance: Modified independence Comments: decreased gait speed                                                                                                                                 TREATMENT DATE:                                     02/18/23 EXERCISE LOG  Exercise Repetitions and Resistance Comments  Recumbent bike  L3 x 15 minutes; seat 12    Rocker board  5 minutes    Lunges onto step  14" step x 3 minutes            Blank cell = exercise not performed today  Modalities: no adverse reaction to today's modalities  Date:  Vaso: Knee, 34 degrees; low pressure, 15 mins, Pain                                   02/16/23 EXERCISE LOG  Exercise Repetitions and Resistance Comments  Nustep  L4 x 15 minutes   Rocker board  5 minutes   Lunges onto step  14" step x 3 minutes   LAQs 2# x 30 reps RLE only   Seated Marches 2# x 30 reps RLE only    Blank cell = exercise not performed today  Manual Therapy Soft Tissue Mobilization: right quadriceps and gastrocnemius, for improved soft tissue extensibility   Modalities: no adverse reaction to today's modalities  Date:  Vaso: Knee, 34 degrees; low pressure, 15 mins, Pain and Edema  PATIENT EDUCATION:  Education details:  prognosis, healing, and expectation for soreness Person educated: Patient Education method: Explanation Education comprehension: verbalized understanding  HOME EXERCISE PROGRAM: [id number]  ASSESSMENT:  CLINICAL IMPRESSION: Patient is making excellent progress with skilled physical therapy as evidenced by his subjective reports, objective measures, functional mobility, and progress toward his goals. He was able to exceed his goal for improved right knee extension as he was within 1 degree of neutral. He was able to demonstrate 116 degrees of right knee flexion, but he was not able to meet his knee flexion goal at this time. He experienced no significant increase in pain or discomfort with any of today's interventions or assessments. He reported that his knee felt good upon the conclusion of treatment. He continues to require skilled physical therapy to  address his remaining  impairments to return to his prior level of function.   OBJECTIVE IMPAIRMENTS: Abnormal gait, decreased activity tolerance, decreased mobility, difficulty walking, decreased ROM, decreased strength, increased edema, and pain.   ACTIVITY LIMITATIONS: sitting, sleeping, stairs, transfers, and locomotion level  PARTICIPATION LIMITATIONS: shopping, community activity, and yard work  PERSONAL FACTORS: 3+ comorbidities: Allergies, arthritis, and COPD  are also affecting patient's functional outcome.   REHAB POTENTIAL: Good  CLINICAL DECISION MAKING: Stable/uncomplicated  EVALUATION COMPLEXITY: Low   GOALS: Goals reviewed with patient? Yes  LONG TERM GOALS: Target date: 03/02/23  Patient will be independent with his HEP.  Baseline:  Goal status: ON GOING  2.  Patient will be able to demonstrate at least 120 degrees of right knee flexion for improved function navigating stairs. Baseline: 116 degrees on 02/18/23 Goal status: ON GOING  3.  Patient will be able to navigate at least 4 steps with a reciprocal pattern for improved household mobility. Baseline: able to perform reciprocal pattern ascending and step to pattern descending Goal status: ON GOING  4.  Patient will improve his right knee extension within 5 degrees of neutral for improved gait mechanics. Baseline: 1 degree on 02/18/23 Goal status: MET  5.  Patient will be able to safely ambulate without an assistive device with no significant gait deviations. Baseline:  Goal status: ON GOING  PLAN:  PT FREQUENCY: 2x/week  PT DURATION: 4 weeks  PLANNED INTERVENTIONS: 97164- PT Re-evaluation, 97110-Therapeutic exercises, 97530- Therapeutic activity, 97112- Neuromuscular re-education, 97535- Self Care, [id number]- Manual therapy, 97116- Gait training, 97014- Electrical stimulation (unattended), 97016- Vasopneumatic device, Patient/Family education, Balance training, Stair training, Joint mobilization, Cryotherapy,  and Moist heat  PLAN FOR NEXT SESSION: Nustep, lower extremity strengthening, gastroc stretch, and modalities as needed   Granville Lewis, PT 02/18/2023, 5:09 PM

## 2023-02-19 ENCOUNTER — Encounter: Payer: Self-pay | Admitting: Family Medicine

## 2023-02-23 ENCOUNTER — Ambulatory Visit
Admission: RE | Admit: 2023-02-23 | Discharge: 2023-02-23 | Disposition: A | Discharge status: Home / Self Care | Payer: Medicare Other | Source: Ambulatory Visit | Attending: Family Medicine | Admitting: Family Medicine

## 2023-02-23 DIAGNOSIS — Z122 Encounter for screening for malignant neoplasm of respiratory organs: Secondary | ICD-10-CM

## 2023-02-23 DIAGNOSIS — Z136 Encounter for screening for cardiovascular disorders: Secondary | ICD-10-CM

## 2023-02-24 ENCOUNTER — Ambulatory Visit: Payer: Medicare Other | Attending: Orthopedic Surgery

## 2023-02-24 DIAGNOSIS — M25661 Stiffness of right knee, not elsewhere classified: Secondary | ICD-10-CM | POA: Diagnosis present

## 2023-02-24 DIAGNOSIS — M25561 Pain in right knee: Secondary | ICD-10-CM | POA: Diagnosis present

## 2023-02-24 DIAGNOSIS — R600 Localized edema: Secondary | ICD-10-CM | POA: Insufficient documentation

## 2023-02-24 NOTE — Therapy (Signed)
 OUTPATIENT PHYSICAL THERAPY LOWER EXTREMITY TREATMENT   Patient Name: Mark [name] MRN: [id number] DOB:[birth year], 68 y.o., male Today's Date: 02/24/2023  END OF SESSION:  PT End of Session - 02/24/23 1610     Visit Number 5    Number of Visits 8    Date for PT Re-Evaluation 03/20/23    PT Start Time 1600    PT Stop Time 1646    PT Time Calculation (min) 46 min    Activity Tolerance Patient tolerated treatment well    Behavior During Therapy WFL for tasks assessed/performed               Past Medical History:  Diagnosis Date   Allergies    Arthritis    Chronic headaches    not in last few years   COPD (chronic obstructive pulmonary disease) (HCC)    ED (erectile dysfunction)    GERD (gastroesophageal reflux disease)    H/O sebaceous cyst    Plantar fasciitis    Past Surgical History:  Procedure Laterality Date   CATARACT EXTRACTION Bilateral    COLONOSCOPY W/ BIOPSIES AND POLYPECTOMY     PARTIAL KNEE ARTHROPLASTY Right 02/04/2023   Procedure: UNICOMPARTMENTAL KNEE-LATERAL;  Surgeon: Edna Toribio LABOR, MD;  Location: [facility];  Service: Orthopedics;  Laterality: Right;   Patient Active Problem List   Diagnosis Date Noted   Non-allergic rhinitis 02/23/2018   Chronic intractable headache 10/13/2016    PCP: Lazoff, Shawn P, DO  REFERRING PROVIDER: Edna Toribio LABOR, MD   REFERRING DIAG: Post Op Right Partial Knee Replacement-Lateral   THERAPY DIAG:  Acute pain of right knee  Stiffness of right knee, not elsewhere classified  Localized edema  Rationale for Evaluation and Treatment: Rehabilitation  ONSET DATE: 02/04/23  SUBJECTIVE:   SUBJECTIVE STATEMENT: Patient reports that he feels good today. Pt reports that he has been on his feet a lot today and has 2/10 right knee pain.   PERTINENT HISTORY: Allergies, arthritis, and COPD PAIN:  Are you having pain? Yes: NPRS scale: 2/10 Pain location: right anterior knee Pain description:  sore Aggravating factors: prolonged sitting and transfers Relieving factors: medication and ice  PRECAUTIONS: None  RED FLAGS: None   WEIGHT BEARING RESTRICTIONS: No  FALLS:  Has patient fallen in last 6 months? No  LIVING ENVIRONMENT: Lives with: lives with their family Lives in: House/apartment Stairs: Yes: Internal: 12 steps; on left going up; step to pattern leading with the left foot going up and the right foot going down  Has following equipment at home: Single point cane and Walker - 2 wheeled  OCCUPATION: retired  PLOF: Independent  PATIENT GOALS: be able climb ladders, crawl under houses, be able to do yard work, and return to his prior level of function   NEXT MD VISIT: 02/19/23  OBJECTIVE:  Note: Objective measures were completed at Evaluation unless otherwise noted.  DIAGNOSTIC FINDINGS: 02/04/23 right knee x-ray IMPRESSION: Interval lateral unicompartmental knee arthroplasty with expected postoperative changes. PATIENT SURVEYS:  FOTO 46.26  COGNITION: Overall cognitive status: Within functional limits for tasks assessed     SENSATION: Patient reports no numbness or tingling  EDEMA:  Circumferential: R tibiofemoral joint line: 50.1 cm L tibiofemoral joint line: 44.0 cm  PALPATION: No tenderness to palpation reported  LOWER EXTREMITY ROM:  Active ROM Right eval Left eval  Hip flexion    Hip extension    Hip abduction    Hip adduction    Hip internal rotation  Hip external rotation    Knee flexion 110 137  Knee extension 10 0  Ankle dorsiflexion    Ankle plantarflexion    Ankle inversion    Ankle eversion     (Blank rows = not tested)  LOWER EXTREMITY MMT: not tested due to surgical condition  GAIT: Assistive device utilized: Environmental Consultant - 2 wheeled Level of assistance: Modified independence Comments: decreased gait speed                                                                                                                                 TREATMENT DATE:                                     02/24/23 EXERCISE LOG  Exercise Repetitions and Resistance Comments  Recumbent bike  L3 x 15 minutes; seat 9   Rocker board     Stair Navigation 4 stairs x 3 reps   Cybex Knee Flexion 40# x 3 mins   Cybex Knee Extension 10# x 3 mins   Cybex Leg Press 2 plates; seat 8; x 3 mins    Blank cell = exercise not performed today  Modalities: no adverse reaction to today's modalities  Date:  Vaso: Knee, 34 degrees; low pressure, 15 mins, Pain                                   02/16/23 EXERCISE LOG  Exercise Repetitions and Resistance Comments  Nustep  L4 x 15 minutes   Rocker board  5 minutes   Lunges onto step  14 step x 3 minutes   LAQs 2# x 30 reps RLE only   Seated Marches 2# x 30 reps RLE only    Blank cell = exercise not performed today  Manual Therapy Soft Tissue Mobilization: right quadriceps and gastrocnemius, for improved soft tissue extensibility   Modalities: no adverse reaction to today's modalities  Date:  Vaso: Knee, 34 degrees; low pressure, 15 mins, Pain and Edema  PATIENT EDUCATION:  Education details:  prognosis, healing, and expectation for soreness Person educated: Patient Education method: Explanation Education comprehension: verbalized understanding  HOME EXERCISE PROGRAM: [id number]  ASSESSMENT:  CLINICAL IMPRESSION: Pt arrives for today's treatment session reporting 2/10 right knee pain.  Pt able to progress to seat 9 today on the recumbent bike without any issue.  Pt able to demonstrate reciprocal pattern when navigating four stairs today meeting his LTG.  Pt introduced to cybex exercises today with min cues required for eccentric control.  Normal responses to vaso noted upon removal.  Pt denied any pain at completion of today's treatment session.   OBJECTIVE IMPAIRMENTS: Abnormal gait, decreased activity tolerance, decreased mobility, difficulty walking, decreased ROM, decreased strength,  increased edema, and pain.   ACTIVITY LIMITATIONS: sitting,  sleeping, stairs, transfers, and locomotion level  PARTICIPATION LIMITATIONS: shopping, community activity, and yard work  PERSONAL FACTORS: 3+ comorbidities: Allergies, arthritis, and COPD  are also affecting patient's functional outcome.   REHAB POTENTIAL: Good  CLINICAL DECISION MAKING: Stable/uncomplicated  EVALUATION COMPLEXITY: Low   GOALS: Goals reviewed with patient? Yes  LONG TERM GOALS: Target date: 03/02/23  Patient will be independent with his HEP.  Baseline:  Goal status: MET  2.  Patient will be able to demonstrate at least 120 degrees of right knee flexion for improved function navigating stairs. Baseline: 116 degrees on 02/18/23 Goal status: ON GOING  3.  Patient will be able to navigate at least 4 steps with a reciprocal pattern for improved household mobility. Baseline: able to perform reciprocal pattern ascending and step to pattern descending Goal status: MET  4.  Patient will improve his right knee extension within 5 degrees of neutral for improved gait mechanics. Baseline: 1 degree on 02/18/23 Goal status: MET  5.  Patient will be able to safely ambulate without an assistive device with no significant gait deviations. Baseline:  Goal status: MET  PLAN:  PT FREQUENCY: 2x/week  PT DURATION: 4 weeks  PLANNED INTERVENTIONS: 97164- PT Re-evaluation, 97110-Therapeutic exercises, 97530- Therapeutic activity, 97112- Neuromuscular re-education, 97535- Self Care, [id number]- Manual therapy, [phone]- Gait training, 97014- Electrical stimulation (unattended), 97016- Vasopneumatic device, Patient/Family education, Balance training, Stair training, Joint mobilization, Cryotherapy, and Moist heat  PLAN FOR NEXT SESSION: Nustep, lower extremity strengthening, gastroc stretch, and modalities as needed   Delon DELENA Gosling, PTA 02/24/2023, 4:52 PM

## 2023-02-26 ENCOUNTER — Encounter: Payer: Self-pay | Admitting: *Deleted

## 2023-02-26 ENCOUNTER — Ambulatory Visit: Payer: Medicare Other | Admitting: *Deleted

## 2023-02-26 DIAGNOSIS — M25561 Pain in right knee: Secondary | ICD-10-CM | POA: Diagnosis not present

## 2023-02-26 DIAGNOSIS — M25661 Stiffness of right knee, not elsewhere classified: Secondary | ICD-10-CM

## 2023-02-26 DIAGNOSIS — R600 Localized edema: Secondary | ICD-10-CM

## 2023-02-26 NOTE — Therapy (Signed)
 OUTPATIENT PHYSICAL THERAPY LOWER EXTREMITY TREATMENT   Patient Name: Mark [name] MRN: [id number] DOB:November 01, 1955, 68 y.o., male Today's Date: 02/26/2023  END OF SESSION:  PT End of Session - 02/26/23 1611     Visit Number 6    Number of Visits 8    Date for PT Re-Evaluation 03/20/23    PT Start Time 1600    PT Stop Time 1650    PT Time Calculation (min) 50 min               Past Medical History:  Diagnosis Date   Allergies    Arthritis    Chronic headaches    not in last few years   COPD (chronic obstructive pulmonary disease) (HCC)    ED (erectile dysfunction)    GERD (gastroesophageal reflux disease)    H/O sebaceous cyst    Plantar fasciitis    Past Surgical History:  Procedure Laterality Date   CATARACT EXTRACTION Bilateral    COLONOSCOPY W/ BIOPSIES AND POLYPECTOMY     PARTIAL KNEE ARTHROPLASTY Right 02/04/2023   Procedure: UNICOMPARTMENTAL KNEE-LATERAL;  Surgeon: Edna Toribio LABOR, MD;  Location: [facility];  Service: Orthopedics;  Laterality: Right;   Patient Active Problem List   Diagnosis Date Noted   Non-allergic rhinitis 02/23/2018   Chronic intractable headache 10/13/2016    PCP: Lazoff, Shawn P, DO  REFERRING PROVIDER: Edna Toribio LABOR, MD   REFERRING DIAG: Post Op Right Partial Knee Replacement-Lateral   THERAPY DIAG:  Acute pain of right knee  Stiffness of right knee, not elsewhere classified  Localized edema  Rationale for Evaluation and Treatment: Rehabilitation  ONSET DATE: 02/04/23  SUBJECTIVE:   SUBJECTIVE STATEMENT: Patient reports that he feels good today.   2/10 RT knee. To MD 03-19-23 Put on hold x 2 weeks.   PERTINENT HISTORY: Allergies, arthritis, and COPD PAIN:  Are you having pain? Yes: NPRS scale: 2/10 Pain location: right anterior knee Pain description: sore Aggravating factors: prolonged sitting and transfers Relieving factors: medication and ice  PRECAUTIONS: None  RED FLAGS: None   WEIGHT BEARING  RESTRICTIONS: No  FALLS:  Has patient fallen in last 6 months? No  LIVING ENVIRONMENT: Lives with: lives with their family Lives in: House/apartment Stairs: Yes: Internal: 12 steps; on left going up; step to pattern leading with the left foot going up and the right foot going down  Has following equipment at home: Single point cane and Walker - 2 wheeled  OCCUPATION: retired  PLOF: Independent  PATIENT GOALS: be able climb ladders, crawl under houses, be able to do yard work, and return to his prior level of function   NEXT MD VISIT: 02/19/23  OBJECTIVE:  Note: Objective measures were completed at Evaluation unless otherwise noted.  DIAGNOSTIC FINDINGS: 02/04/23 right knee x-ray IMPRESSION: Interval lateral unicompartmental knee arthroplasty with expected postoperative changes. PATIENT SURVEYS:  FOTO 46.26  COGNITION: Overall cognitive status: Within functional limits for tasks assessed     SENSATION: Patient reports no numbness or tingling  EDEMA:  Circumferential: R tibiofemoral joint line: 50.1 cm L tibiofemoral joint line: 44.0 cm  PALPATION: No tenderness to palpation reported  LOWER EXTREMITY ROM:  Active ROM Right eval Left eval  Hip flexion    Hip extension    Hip abduction    Hip adduction    Hip internal rotation    Hip external rotation    Knee flexion 110 137  Knee extension 10 0  Ankle dorsiflexion    Ankle plantarflexion  Ankle inversion    Ankle eversion     (Blank rows = not tested)  LOWER EXTREMITY MMT: not tested due to surgical condition  GAIT: Assistive device utilized: Environmental Consultant - 2 wheeled Level of assistance: Modified independence Comments: decreased gait speed                                                                                                                                TREATMENT DATE:                                     02/26/23 EXERCISE LOG   RT knee  Exercise Repetitions and Resistance Comments  Recumbent  bike  L3 x 15 minutes; seat 9   Rocker board     Stair Navigation    Cybex Knee Flexion 40# x 3 mins   Cybex Knee Extension 10# x 3 mins   Cybex Leg Press     Blank cell = exercise not performed today  Modalities: no adverse reaction to today's modalities Manual PROM for flexion. Date:  Vaso: Knee, 34 degrees; low pressure, 15 mins, Pain                                   02/16/23 EXERCISE LOG  Exercise Repetitions and Resistance Comments  Nustep  L4 x 15 minutes   Rocker board  5 minutes   Lunges onto step  14 step x 3 minutes   LAQs 2# x 30 reps RLE only   Seated Marches 2# x 30 reps RLE only    Blank cell = exercise not performed today  Manual Therapy Soft Tissue Mobilization: right quadriceps and gastrocnemius, for improved soft tissue extensibility   Modalities: no adverse reaction to today's modalities  Date:  Vaso: Knee, 34 degrees; low pressure, 15 mins, Pain and Edema  PATIENT EDUCATION:  Education details:  prognosis, healing, and expectation for soreness Person educated: Patient Education method: Explanation Education comprehension: verbalized understanding  HOME EXERCISE PROGRAM: [id number]  ASSESSMENT:  CLINICAL IMPRESSION: Pt arrives for today's treatment session reporting 2/10 right knee pain.  Pt was able to continue with therex for strengthening as well as ROM for RT knee and did well. All LTG's met at this time and Pt will be put on hold until MD f/u    OBJECTIVE IMPAIRMENTS: Abnormal gait, decreased activity tolerance, decreased mobility, difficulty walking, decreased ROM, decreased strength, increased edema, and pain.   ACTIVITY LIMITATIONS: sitting, sleeping, stairs, transfers, and locomotion level  PARTICIPATION LIMITATIONS: shopping, community activity, and yard work  PERSONAL FACTORS: 3+ comorbidities: Allergies, arthritis, and COPD  are also affecting patient's functional outcome.   REHAB POTENTIAL: Good  CLINICAL DECISION MAKING:  Stable/uncomplicated  EVALUATION COMPLEXITY: Low   GOALS: Goals reviewed with patient? Yes  LONG TERM  GOALS: Target date: 03/02/23  Patient will be independent with his HEP.  Baseline:  Goal status: MET  2.  Patient will be able to demonstrate at least 120 degrees of right knee flexion for improved function navigating stairs. Baseline: 116 degrees on 02/18/23 Goal status: MET 120 degrees  3.  Patient will be able to navigate at least 4 steps with a reciprocal pattern for improved household mobility. Baseline: able to perform reciprocal pattern ascending and step to pattern descending Goal status: MET  4.  Patient will improve his right knee extension within 5 degrees of neutral for improved gait mechanics. Baseline: 1 degree on 02/18/23 Goal status: MET  5.  Patient will be able to safely ambulate without an assistive device with no significant gait deviations. Baseline:  Goal status: MET  PLAN:  PT FREQUENCY: 2x/week  PT DURATION: 4 weeks  PLANNED INTERVENTIONS: 97164- PT Re-evaluation, 97110-Therapeutic exercises, 97530- Therapeutic activity, 97112- Neuromuscular re-education, 97535- Self Care, 02859- Manual therapy, [phone]- Gait training, 97014- Electrical stimulation (unattended), 97016- Vasopneumatic device, Patient/Family education, Balance training, Stair training, Joint mobilization, Cryotherapy, and Moist heat  PLAN FOR NEXT SESSION: Nustep, lower extremity strengthening, gastroc stretch, and modalities as needed   [place] Yearby,CHRIS, PTA 02/26/2023, 5:44 PM

## 2023-03-10 ENCOUNTER — Ambulatory Visit: Payer: Medicare Other

## 2023-03-10 DIAGNOSIS — M25561 Pain in right knee: Secondary | ICD-10-CM

## 2023-03-10 DIAGNOSIS — R600 Localized edema: Secondary | ICD-10-CM

## 2023-03-10 DIAGNOSIS — M25661 Stiffness of right knee, not elsewhere classified: Secondary | ICD-10-CM

## 2023-03-10 NOTE — Therapy (Addendum)
OUTPATIENT PHYSICAL THERAPY LOWER EXTREMITY TREATMENT   Patient Name: Mark [name] MRN: [id number] DOB:April 06, 1955, 68 y.o., male Today's Date: 03/10/2023  END OF SESSION:  PT End of Session - 03/10/23 1605     Visit Number 7    Number of Visits 8    Date for PT Re-Evaluation 03/20/23    PT Start Time 1600               Past Medical History:  Diagnosis Date   Allergies    Arthritis    Chronic headaches    not in last few years   COPD (chronic obstructive pulmonary disease) (HCC)    ED (erectile dysfunction)    GERD (gastroesophageal reflux disease)    H/O sebaceous cyst    Plantar fasciitis    Past Surgical History:  Procedure Laterality Date   CATARACT EXTRACTION Bilateral    COLONOSCOPY W/ BIOPSIES AND POLYPECTOMY     PARTIAL KNEE ARTHROPLASTY Right 02/04/2023   Procedure: UNICOMPARTMENTAL KNEE-LATERAL;  Surgeon: Joen Laura, MD;  Location: [facility];  Service: Orthopedics;  Laterality: Right;   Patient Active Problem List   Diagnosis Date Noted   Non-allergic rhinitis 02/23/2018   Chronic intractable headache 10/13/2016    PCP: Mattie Marlin, DO  REFERRING PROVIDER: Joen Laura, MD   REFERRING DIAG: Post Op Right Partial Knee Replacement-Lateral   THERAPY DIAG:  Acute pain of right knee  Stiffness of right knee, not elsewhere classified  Localized edema  Rationale for Evaluation and Treatment: Rehabilitation  ONSET DATE: 02/04/23  SUBJECTIVE:   SUBJECTIVE STATEMENT: Patient reports that he feels good today.   2/10 RT knee. To MD 03-19-23 Put on hold x 2 weeks.   PERTINENT HISTORY: Allergies, arthritis, and COPD PAIN:  Are you having pain? Yes: NPRS scale: 2/10 Pain location: right anterior knee Pain description: sore Aggravating factors: prolonged sitting and transfers Relieving factors: medication and ice  PRECAUTIONS: None  RED FLAGS: None   WEIGHT BEARING RESTRICTIONS: No  FALLS:  Has patient fallen in last 6  months? No  LIVING ENVIRONMENT: Lives with: lives with their family Lives in: House/apartment Stairs: Yes: Internal: 12 steps; on left going up; step to pattern leading with the left foot going up and the right foot going down  Has following equipment at home: Single point cane and Walker - 2 wheeled  OCCUPATION: retired  PLOF: Independent  PATIENT GOALS: be able climb ladders, crawl under houses, be able to do yard work, and return to his prior level of function   NEXT MD VISIT: 02/19/23  OBJECTIVE:  Note: Objective measures were completed at Evaluation unless otherwise noted.  DIAGNOSTIC FINDINGS: 02/04/23 right knee x-ray IMPRESSION: Interval lateral unicompartmental knee arthroplasty with expected postoperative changes. PATIENT SURVEYS:  FOTO 46.26  COGNITION: Overall cognitive status: Within functional limits for tasks assessed     SENSATION: Patient reports no numbness or tingling  EDEMA:  Circumferential: R tibiofemoral joint line: 50.1 cm L tibiofemoral joint line: 44.0 cm  PALPATION: No tenderness to palpation reported  LOWER EXTREMITY ROM:  Active ROM Right eval Left eval  Hip flexion    Hip extension    Hip abduction    Hip adduction    Hip internal rotation    Hip external rotation    Knee flexion 110 137  Knee extension 10 0  Ankle dorsiflexion    Ankle plantarflexion    Ankle inversion    Ankle eversion     (Blank rows =  not tested)  LOWER EXTREMITY MMT: not tested due to surgical condition  GAIT: Assistive device utilized: Environmental consultant - 2 wheeled Level of assistance: Modified independence Comments: decreased gait speed                                                                                                                                TREATMENT DATE:                                     03/10/23 EXERCISE LOG   RT knee  Exercise Repetitions and Resistance Comments  Recumbent bike  L3 x 20 minutes; seat 11-8   Rocker board      Stair Navigation    Cybex Knee Flexion 40# x 3.5 mins   Cybex Knee Extension 20# x 3.5 mins   Cybex Leg Press     Blank cell = exercise not performed today  Modalities: no adverse reaction to today's modalities  Date:  Vaso: Knee, 34 degrees; low pressure, 15 mins, Pain                                   02/16/23 EXERCISE LOG  Exercise Repetitions and Resistance Comments  Nustep  L4 x 15 minutes   Rocker board  5 minutes   Lunges onto step  14" step x 3 minutes   LAQs 2# x 30 reps RLE only   Seated Marches 2# x 30 reps RLE only    Blank cell = exercise not performed today  Manual Therapy Soft Tissue Mobilization: right quadriceps and gastrocnemius, for improved soft tissue extensibility   Modalities: no adverse reaction to today's modalities  Date:  Vaso: Knee, 34 degrees; low pressure, 15 mins, Pain and Edema  PATIENT EDUCATION:  Education details:  prognosis, healing, and expectation for soreness Person educated: Patient Education method: Explanation Education comprehension: verbalized understanding  HOME EXERCISE PROGRAM: [id number]  ASSESSMENT:  CLINICAL IMPRESSION: Pt arrives for today's treatment session reporting 2/10 right knee pain.  Pt able to progress to set 8 on the recumbent bike today and reports that his knee feels much better after completing the bike.  Pt able to tolerate increased resistance with cybex exercises today.  Pt able to demonstrate 118 degrees of active right knee flexion today and states that he would like to put his socks on easier.  Normal responses to vaso noted upon removal.  Pt denied any pain at completion of today's treatment session.   OBJECTIVE IMPAIRMENTS: Abnormal gait, decreased activity tolerance, decreased mobility, difficulty walking, decreased ROM, decreased strength, increased edema, and pain.   ACTIVITY LIMITATIONS: sitting, sleeping, stairs, transfers, and locomotion level  PARTICIPATION LIMITATIONS: shopping, community  activity, and yard work  PERSONAL FACTORS: 3+ comorbidities: Allergies, arthritis, and COPD  are also affecting patient's functional  outcome.   REHAB POTENTIAL: Good  CLINICAL DECISION MAKING: Stable/uncomplicated  EVALUATION COMPLEXITY: Low   GOALS: Goals reviewed with patient? Yes  LONG TERM GOALS: Target date: 03/02/23  Patient will be independent with his HEP.  Baseline:  Goal status: MET  2.  Patient will be able to demonstrate at least 125 degrees of right knee flexion for improved function putting on his socks. Baseline: 118 degrees on 03/10/23 Goal status: REVISED  3.  Patient will be able to navigate at least 4 steps with a reciprocal pattern for improved household mobility. Baseline: able to perform reciprocal pattern ascending and step to pattern descending Goal status: MET  4.  Patient will improve his right knee extension within 5 degrees of neutral for improved gait mechanics. Baseline: 1 degree on 02/18/23 Goal status: MET  5.  Patient will be able to safely ambulate without an assistive device with no significant gait deviations. Baseline:  Goal status: MET  PLAN:  PT FREQUENCY: 2x/week  PT DURATION: 4 weeks  PLANNED INTERVENTIONS: 97164- PT Re-evaluation, 97110-Therapeutic exercises, 97530- Therapeutic activity, 97112- Neuromuscular re-education, 97535- Self Care, 78295- Manual therapy, [phone]- Gait training, 97014- Electrical stimulation (unattended), 97016- Vasopneumatic device, Patient/Family education, Balance training, Stair training, Joint mobilization, Cryotherapy, and Moist heat  PLAN FOR NEXT SESSION: Nustep, lower extremity strengthening, gastroc stretch, and modalities as needed   Newman Pies, PTA 03/10/2023, 4:39 PM

## 2023-03-12 ENCOUNTER — Ambulatory Visit: Payer: Medicare Other | Admitting: *Deleted

## 2023-03-12 ENCOUNTER — Encounter: Payer: Self-pay | Admitting: *Deleted

## 2023-03-12 DIAGNOSIS — M25661 Stiffness of right knee, not elsewhere classified: Secondary | ICD-10-CM

## 2023-03-12 DIAGNOSIS — M25561 Pain in right knee: Secondary | ICD-10-CM | POA: Diagnosis not present

## 2023-03-12 DIAGNOSIS — R600 Localized edema: Secondary | ICD-10-CM

## 2023-03-12 NOTE — Therapy (Signed)
OUTPATIENT PHYSICAL THERAPY LOWER EXTREMITY TREATMENT   Patient Name: Mark [name] MRN: [id number] DOB:[birth year], 68 y.o., male Today's Date: 03/12/2023  END OF SESSION:  PT End of Session - 03/12/23 1347     Visit Number 8    Number of Visits 8    Date for PT Re-Evaluation 03/20/23    PT Start Time 1347    PT Stop Time 1432    PT Time Calculation (min) 45 min               Past Medical History:  Diagnosis Date   Allergies    Arthritis    Chronic headaches    not in last few years   COPD (chronic obstructive pulmonary disease) (HCC)    ED (erectile dysfunction)    GERD (gastroesophageal reflux disease)    H/O sebaceous cyst    Plantar fasciitis    Past Surgical History:  Procedure Laterality Date   CATARACT EXTRACTION Bilateral    COLONOSCOPY W/ BIOPSIES AND POLYPECTOMY     PARTIAL KNEE ARTHROPLASTY Right 02/04/2023   Procedure: UNICOMPARTMENTAL KNEE-LATERAL;  Surgeon: Joen Laura, MD;  Location: [facility];  Service: Orthopedics;  Laterality: Right;   Patient Active Problem List   Diagnosis Date Noted   Non-allergic rhinitis 02/23/2018   Chronic intractable headache 10/13/2016    PCP: Mattie Marlin, DO  REFERRING PROVIDER: Joen Laura, MD   REFERRING DIAG: Post Op Right Partial Knee Replacement-Lateral   THERAPY DIAG:  Acute pain of right knee  Localized edema  Stiffness of right knee, not elsewhere classified  Rationale for Evaluation and Treatment: Rehabilitation  ONSET DATE: 02/04/23  SUBJECTIVE:   SUBJECTIVE STATEMENT: Patient reports that he feels good today.  DC today   PERTINENT HISTORY: Allergies, arthritis, and COPD PAIN:  Are you having pain? Yes: NPRS scale: 1/10 Pain location: right anterior knee Pain description: sore Aggravating factors: prolonged sitting and transfers Relieving factors: medication and ice  PRECAUTIONS: None  RED FLAGS: None   WEIGHT BEARING RESTRICTIONS: No  FALLS:  Has patient  fallen in last 6 months? No  LIVING ENVIRONMENT: Lives with: lives with their family Lives in: House/apartment Stairs: Yes: Internal: 12 steps; on left going up; step to pattern leading with the left foot going up and the right foot going down  Has following equipment at home: Single point cane and Walker - 2 wheeled  OCCUPATION: retired  PLOF: Independent  PATIENT GOALS: be able climb ladders, crawl under houses, be able to do yard work, and return to his prior level of function   NEXT MD VISIT: 02/19/23  OBJECTIVE:  Note: Objective measures were completed at Evaluation unless otherwise noted.  DIAGNOSTIC FINDINGS: 02/04/23 right knee x-ray IMPRESSION: Interval lateral unicompartmental knee arthroplasty with expected postoperative changes. PATIENT SURVEYS:  FOTO 46.26  COGNITION: Overall cognitive status: Within functional limits for tasks assessed     SENSATION: Patient reports no numbness or tingling  EDEMA:  Circumferential: R tibiofemoral joint line: 50.1 cm L tibiofemoral joint line: 44.0 cm  PALPATION: No tenderness to palpation reported  LOWER EXTREMITY ROM:  Active ROM Right eval Left eval  Hip flexion    Hip extension    Hip abduction    Hip adduction    Hip internal rotation    Hip external rotation    Knee flexion 110 137  Knee extension 10 0  Ankle dorsiflexion    Ankle plantarflexion    Ankle inversion    Ankle eversion     (  Blank rows = not tested)  LOWER EXTREMITY MMT: not tested due to surgical condition  GAIT: Assistive device utilized: Environmental consultant - 2 wheeled Level of assistance: Modified independence Comments: decreased gait speed                                                                                                                                TREATMENT DATE:                                     03/12/23 EXERCISE LOG   RT knee  Exercise Repetitions and Resistance Comments  Recumbent bike  L3 x 20 minutes; seat 11-8    Rocker board     Stair Navigation    Cybex Knee Flexion 40# x 3.5 mins   Cybex Knee Extension 20# x 3.5 mins   Cybex Leg Press     Blank cell = exercise not performed today  Modalities: no adverse reaction to today's modalities Manual PROM to RT knee for flexion. 125 degrees today Date: Reviewed HEP  Vaso: Knee, 34 degrees; low pressure, 15 mins, Pain                                   02/16/23 EXERCISE LOG  Exercise Repetitions and Resistance Comments  Nustep  L4 x 15 minutes   Rocker board  5 minutes   Lunges onto step  14" step x 3 minutes   LAQs 2# x 30 reps RLE only   Seated Marches 2# x 30 reps RLE only    Blank cell = exercise not performed today  Manual Therapy Soft Tissue Mobilization: right quadriceps and gastrocnemius, for improved soft tissue extensibility   Modalities: no adverse reaction to today's modalities  Date:  Vaso: Knee, 34 degrees; low pressure, 15 mins, Pain and Edema  PATIENT EDUCATION:  Education details:  prognosis, healing, and expectation for soreness Person educated: Patient Education method: Explanation Education comprehension: verbalized understanding  HOME EXERCISE PROGRAM: [id number]  ASSESSMENT:  CLINICAL IMPRESSION: Pt arrives for today's treatment session reporting 1/10 right knee pain.  Pt was able to continue with RT knee strengthening exs and did well. He is independent in HEP and was able to meet all LTG's   PHYSICAL THERAPY DISCHARGE SUMMARY  Visits from Start of Care: 8  Current functional level related to goals / functional outcomes: Patient was able to meet all of his long term goals for physical therapy.   Remaining deficits: None    Education / Equipment: HEP    Patient agrees to discharge. Patient goals were met. Patient is being discharged due to meeting the stated rehab goals.  Candi Leash, PT, DPT    OBJECTIVE IMPAIRMENTS: Abnormal gait, decreased activity tolerance, decreased mobility, difficulty walking,  decreased ROM, decreased strength, increased edema,  and pain.   ACTIVITY LIMITATIONS: sitting, sleeping, stairs, transfers, and locomotion level  PARTICIPATION LIMITATIONS: shopping, community activity, and yard work  PERSONAL FACTORS: 3+ comorbidities: Allergies, arthritis, and COPD  are also affecting patient's functional outcome.   REHAB POTENTIAL: Good  CLINICAL DECISION MAKING: Stable/uncomplicated  EVALUATION COMPLEXITY: Low   GOALS: Goals reviewed with patient? Yes  LONG TERM GOALS: Target date: 03/02/23  Patient will be independent with his HEP.  Baseline:  Goal status: MET  2.  Patient will be able to demonstrate at least 125 degrees of right knee flexion for improved function putting on his socks. Baseline: 118 degrees on 03/10/23 Goal status: MET 125 degrees  3.  Patient will be able to navigate at least 4 steps with a reciprocal pattern for improved household mobility. Baseline: able to perform reciprocal pattern ascending and step to pattern descending Goal status: MET  4.  Patient will improve his right knee extension within 5 degrees of neutral for improved gait mechanics. Baseline: 1 degree on 02/18/23 Goal status: MET  5.  Patient will be able to safely ambulate without an assistive device with no significant gait deviations. Baseline:  Goal status: MET  PLAN:  PT FREQUENCY: 2x/week  PT DURATION: 4 weeks  PLANNED INTERVENTIONS: 97164- PT Re-evaluation, 97110-Therapeutic exercises, 97530- Therapeutic activity, 97112- Neuromuscular re-education, 97535- Self Care, 40981- Manual therapy, [id number]- Gait training, 97014- Electrical stimulation (unattended), 97016- Vasopneumatic device, Patient/Family education, Balance training, Stair training, Joint mobilization, Cryotherapy, and Moist heat  PLAN FOR NEXT SESSION: DC to HEP  Shion Bluestein,CHRIS, PTA 03/12/2023, 2:53 PM

## 2023-08-14 ENCOUNTER — Ambulatory Visit (INDEPENDENT_AMBULATORY_CARE_PROVIDER_SITE_OTHER): Admitting: Otolaryngology

## 2023-08-14 ENCOUNTER — Encounter (INDEPENDENT_AMBULATORY_CARE_PROVIDER_SITE_OTHER): Payer: Self-pay | Admitting: Otolaryngology

## 2023-08-14 VITALS — BP 141/86 | HR 60 | Ht 74.0 in | Wt 205.0 lb

## 2023-08-14 DIAGNOSIS — H9313 Tinnitus, bilateral: Secondary | ICD-10-CM | POA: Diagnosis not present

## 2023-08-14 DIAGNOSIS — H903 Sensorineural hearing loss, bilateral: Secondary | ICD-10-CM | POA: Diagnosis not present

## 2023-08-14 NOTE — Progress Notes (Signed)
 CC: Bilateral tinnitus  HPI:  Mark [name] is a 68 y.o. male who presents today complaining of bilateral tinnitus.  He has been symptomatic for several years.  However, the severity of his tinnitus has increased over the past month.  He describes the tinnitus as a constant high-pitched ringing noise.  It is worse on the left side.  The patient was previously seen in 2021.  At that time, he was noted to have bilateral symmetric high-frequency sensorineural hearing loss.  He denies any recent change in his hearing.  He has a history of loud noise exposure.  Currently he denies any otalgia, otorrhea, or vertigo.  Past Medical History:  Diagnosis Date   Allergies    Arthritis    Chronic headaches    not in last few years   COPD (chronic obstructive pulmonary disease) (HCC)    ED (erectile dysfunction)    GERD (gastroesophageal reflux disease)    H/O sebaceous cyst    Plantar fasciitis     Past Surgical History:  Procedure Laterality Date   CATARACT EXTRACTION Bilateral    COLONOSCOPY W/ BIOPSIES AND POLYPECTOMY     PARTIAL KNEE ARTHROPLASTY Right 02/04/2023   Procedure: UNICOMPARTMENTAL KNEE-LATERAL;  Surgeon: Edna Toribio LABOR, MD;  Location: [facility];  Service: Orthopedics;  Laterality: Right;    Family History  Problem Relation Age of Onset   Heart disease Mother    COPD Father     Social History:  reports that he has never smoked. He has never used smokeless tobacco. He reports that he does not drink alcohol and does not use drugs.  Allergies: No Known Allergies  Prior to Admission medications   Medication Sig Start Date End Date Taking? Authorizing Provider  chlorhexidine  (HIBICLENS ) 4 % external liquid Apply 15 mLs (1 Application total) topically as directed for 30 doses. Use as directed daily for 5 days every other week for 6 weeks. 02/04/23  Yes Renae Bernarda HERO, PA-C  esomeprazole (NEXIUM) 40 MG capsule Take 40 mg by mouth daily before breakfast.   Yes [provider]  fluticasone (FLONASE) 50 MCG/ACT nasal spray Place 2 sprays into both nostrils in the morning.   Yes [provider]  polyethylene glycol (MIRALAX ) 17 g packet Take 17 g by mouth daily. 02/04/23  Yes Renae Bernarda HERO, PA-C  sildenafil (VIAGRA) 100 MG tablet Take 50 mg by mouth daily as needed for erectile dysfunction.   Yes [provider]  Polyethyl Glycol-Propyl Glycol (SYSTANE) 0.4-0.3 % SOLN Place 1-2 drops into both eyes 3 (three) times daily as needed (dry/irritated eyes.).    [provider]    Blood pressure (!) 141/86, pulse 60, height 6' 2 (1.88 m), weight 205 lb (93 kg), SpO2 97%. Exam: General: Communicates without difficulty, well nourished, no acute distress. Head: Normocephalic, no evidence injury, no tenderness, facial buttresses intact without stepoff. Face/sinus: No tenderness to palpation and percussion. Facial movement is normal and symmetric. Eyes: PERRL, EOMI. No scleral icterus, conjunctivae clear. Neuro: CN II exam reveals vision grossly intact.  No nystagmus at any point of gaze. Ears: Auricles well formed without lesions.  Ear canals are intact without mass or lesion.  No erythema or edema is appreciated.  The TMs are intact without fluid. Nose: External evaluation reveals normal support and skin without lesions.  Dorsum is intact.  Anterior rhinoscopy reveals normal mucosa over anterior aspect of inferior turbinates and intact septum.  No purulence noted. Oral:  Oral cavity and oropharynx are  intact, symmetric, without erythema or edema.  Mucosa is moist without lesions. Neck: Full range of motion without pain.  There is no significant lymphadenopathy.  No masses palpable.  Thyroid bed within normal limits to palpation.  Parotid glands and submandibular glands equal bilaterally without mass.  Trachea is midline. Neuro:  CN 2-12 grossly intact.   Assessment: 1.  Bilateral subjective tinnitus.  This is likely secondary to his  sensorineural hearing loss. 2.  Bilateral high-frequency sensorineural hearing loss, likely secondary to presbycusis. 3.  The patient's ear canals, tympanic membranes, and middle ear spaces are all normal.  Plan: 1.  The physical exam findings are reviewed with the patient. 2.  The strategies to cope with tinnitus, including the use of masker, hearing aids, tinnitus retraining therapy, and avoidance of caffeine and alcohol are discussed. 3.  The patient is encouraged to call with any questions or concerns.  Ardie Dragoo W Fredrika Canby 08/14/2023, 10:08 PM
